# Patient Record
Sex: Female | Born: 1975 | Race: White | Hispanic: No | Smoking: Never smoker
Health system: Southern US, Community
[De-identification: ages and names within clinical notes are randomized; demographics above are authoritative.]

## PROBLEM LIST (undated history)

## (undated) HISTORY — PX: ABDOMINAL HYSTERECTOMY: SHX81

## (undated) HISTORY — PX: CHOLECYSTECTOMY: SHX55

---

## 2009-06-10 ENCOUNTER — Encounter: Admission: RE | Admit: 2009-06-10 | Discharge: 2009-06-10 | Payer: Self-pay | Admitting: Orthopedic Surgery

## 2009-11-23 ENCOUNTER — Ambulatory Visit: Payer: Self-pay | Admitting: Diagnostic Radiology

## 2009-11-23 ENCOUNTER — Emergency Department (HOSPITAL_BASED_OUTPATIENT_CLINIC_OR_DEPARTMENT_OTHER): Admission: EM | Admit: 2009-11-23 | Discharge: 2009-11-23 | Payer: Self-pay | Admitting: Emergency Medicine

## 2010-09-25 LAB — URINALYSIS, ROUTINE W REFLEX MICROSCOPIC
Bilirubin Urine: NEGATIVE
Hgb urine dipstick: NEGATIVE
Protein, ur: NEGATIVE mg/dL
Urobilinogen, UA: 1 mg/dL (ref 0.0–1.0)
pH: 6.5 (ref 5.0–8.0)

## 2010-09-25 LAB — DIFFERENTIAL
Basophils Absolute: 0.1 10*3/uL (ref 0.0–0.1)
Basophils Relative: 1 % (ref 0–1)
Lymphocytes Relative: 24 % (ref 12–46)
Lymphs Abs: 2.5 10*3/uL (ref 0.7–4.0)
Neutrophils Relative %: 65 % (ref 43–77)

## 2010-09-25 LAB — CBC
Hemoglobin: 14.9 g/dL (ref 12.0–15.0)
Platelets: 325 10*3/uL (ref 150–400)
RBC: 5.06 MIL/uL (ref 3.87–5.11)

## 2010-09-25 LAB — COMPREHENSIVE METABOLIC PANEL
AST: 38 U/L — ABNORMAL HIGH (ref 0–37)
Albumin: 4.2 g/dL (ref 3.5–5.2)
BUN: 5 mg/dL — ABNORMAL LOW (ref 6–23)
CO2: 30 mEq/L (ref 19–32)
Calcium: 9.5 mg/dL (ref 8.4–10.5)
GFR calc non Af Amer: >60 mL/min (ref >60)
Glucose, Bld: 85 mg/dL (ref 70–99)
Potassium: 2.7 mEq/L — CL (ref 3.5–5.1)

## 2013-08-29 ENCOUNTER — Emergency Department (HOSPITAL_BASED_OUTPATIENT_CLINIC_OR_DEPARTMENT_OTHER)
Admission: EM | Admit: 2013-08-29 | Discharge: 2013-08-29 | Disposition: A | Discharge status: Home / Self Care | Payer: Medicaid Other | Attending: Emergency Medicine | Admitting: Emergency Medicine

## 2013-08-29 ENCOUNTER — Encounter (HOSPITAL_BASED_OUTPATIENT_CLINIC_OR_DEPARTMENT_OTHER): Payer: Self-pay | Admitting: Emergency Medicine

## 2013-08-29 DIAGNOSIS — J069 Acute upper respiratory infection, unspecified: Secondary | ICD-10-CM | POA: Insufficient documentation

## 2013-08-29 DIAGNOSIS — M545 Low back pain, unspecified: Secondary | ICD-10-CM | POA: Insufficient documentation

## 2013-08-29 DIAGNOSIS — R630 Anorexia: Secondary | ICD-10-CM | POA: Insufficient documentation

## 2013-08-29 LAB — RAPID STREP SCREEN (MED CTR MEBANE ONLY): Streptococcus, Group A Screen (Direct): NEGATIVE

## 2013-08-29 MED ORDER — BENZONATATE 100 MG PO CAPS: 100.0000 mg | ORAL_CAPSULE | Freq: Three times a day (TID) | ORAL | Status: DC

## 2013-08-29 MED ORDER — GUAIFENESIN 100 MG/5ML PO LIQD: 100.0000 mg | ORAL | Status: DC | PRN

## 2013-08-29 NOTE — ED Provider Notes (Signed)
CSN: [id number]     Arrival date & time 08/29/13  [id number] History   First MD Initiated Contact with Patient 08/29/13 1158     Chief Complaint  Patient presents with  . Nasal Congestion  . Cough  . Sore Throat     (Consider location/radiation/quality/duration/timing/severity/associated sxs/prior Treatment) HPI  38 year old female here with viral syndrome. Patient states since yesterday she developed subjective fever, chills, headache, sore throat, nonproductive cough, occasional sneeze and, and general body aches including pain to her low back and to her arms and legs. She has been taking Mucinex and Tylenol with some improvement. She has decreased appetite. She denies neck stiffness, hemoptysis, shortness of breath, abdominal pain, nausea vomiting diarrhea, dysuria, or rash. States she was diagnosed with a sinus infection and infection in January and was treated with amoxicillin and Keflex with resolutions of symptoms. State she felt normal for about 2 weeks until this symptom started yesterday.  History reviewed. No pertinent past medical history. History reviewed. No pertinent past surgical history. No family history on file. History  Substance Use Topics  . Smoking status: Never Smoker   . Smokeless tobacco: Not on file  . Alcohol Use: Not on file   OB History   Grav Para Term Preterm Abortions TAB SAB Ect Mult Living                 Review of Systems  All other systems reviewed and are negative.      Allergies  Morphine and related and Sulfa antibiotics  Home Medications  No current outpatient prescriptions on file. BP 123/84  Pulse 73  Temp(Src) 98.3 F (36.8 C) (Oral)  Resp 18  Wt 134 lb (60.782 kg)  SpO2 99% Physical Exam  Nursing note and vitals reviewed. Constitutional: She is oriented to person, place, and time. She appears well-developed and well-nourished. No distress.  HENT:  Head: Atraumatic.  Ears: Normal TM bilateral  Nose: normal nares  Throat:  Uvula is midline, no tonsillar enlargement or exudates, no trismus  Eyes: Conjunctivae are normal.  Neck: Neck supple.  No nuchal rigidity  Cardiovascular: Normal rate and regular rhythm.   Pulmonary/Chest: Effort normal and breath sounds normal. She has no wheezes. She has no rales.  Abdominal: Soft. There is no tenderness.  Musculoskeletal: She exhibits tenderness (Mild paralumbar tenderness without CVA tenderness).  Neurological: She is alert and oriented to person, place, and time.  Skin: No rash noted.  Psychiatric: She has a normal mood and affect.    ED Course  Procedures (including critical care time)  Patient with URI symptoms. She is afebrile with stable normal vital sign, no hypoxia to suggest pneumonia. Her lungs sound clear. No obvious evidence suggestive of strep however a rapid strep test was obtained  Labs Review Labs Reviewed  RAPID STREP SCREEN  CULTURE, GROUP A STREP   Imaging Review No results found.  EKG Interpretation   None       MDM   Final diagnoses:  URI (upper respiratory infection)    BP 123/84  Pulse 73  Temp(Src) 98.3 F (36.8 C) (Oral)  Resp 18  Wt 134 lb (60.782 kg)  SpO2 99%     Fayrene HelperBowie Xenia Nile, PA-C 08/29/13 1333

## 2013-08-29 NOTE — ED Notes (Signed)
Patient here with 3rd round of illness since early January. Complains of 2 days of sore throat, headache, cough and general body aches. Has taken amoxicillin and cephalexin without relief

## 2013-08-29 NOTE — ED Provider Notes (Signed)
Medical screening examination/treatment/procedure(s) were performed by non-physician practitioner and as supervising physician I was immediately available for consultation/collaboration.  EKG Interpretation   None         Reyaan Thoma S Esmerelda Finnigan, MD 08/29/13 2018 

## 2013-08-29 NOTE — Discharge Instructions (Signed)

## 2013-08-31 LAB — CULTURE, GROUP A STREP

## 2014-06-03 ENCOUNTER — Encounter (HOSPITAL_BASED_OUTPATIENT_CLINIC_OR_DEPARTMENT_OTHER): Payer: Self-pay

## 2014-06-03 ENCOUNTER — Emergency Department (HOSPITAL_BASED_OUTPATIENT_CLINIC_OR_DEPARTMENT_OTHER): Payer: 59

## 2014-06-03 ENCOUNTER — Emergency Department (HOSPITAL_BASED_OUTPATIENT_CLINIC_OR_DEPARTMENT_OTHER)
Admission: EM | Admit: 2014-06-03 | Discharge: 2014-06-04 | Disposition: A | Discharge status: Home / Self Care | Payer: 59 | Attending: Emergency Medicine | Admitting: Emergency Medicine

## 2014-06-03 DIAGNOSIS — M940 Chondrocostal junction syndrome [Tietze]: Secondary | ICD-10-CM | POA: Diagnosis not present

## 2014-06-03 DIAGNOSIS — R079 Chest pain, unspecified: Secondary | ICD-10-CM | POA: Diagnosis present

## 2014-06-03 DIAGNOSIS — R52 Pain, unspecified: Secondary | ICD-10-CM

## 2014-06-03 NOTE — ED Notes (Signed)
C/p CP x 2 days

## 2014-06-03 NOTE — ED Notes (Signed)
EDP reviewed EKG-no orders

## 2014-06-04 ENCOUNTER — Encounter (HOSPITAL_BASED_OUTPATIENT_CLINIC_OR_DEPARTMENT_OTHER): Payer: Self-pay | Admitting: Emergency Medicine

## 2014-06-04 DIAGNOSIS — M940 Chondrocostal junction syndrome [Tietze]: Secondary | ICD-10-CM | POA: Diagnosis not present

## 2014-06-04 LAB — CBC WITH DIFFERENTIAL/PLATELET
BASOS ABS: 0.1 10*3/uL (ref 0.0–0.1)
Basophils Relative: 0 % (ref 0–1)
EOS ABS: 0.2 10*3/uL (ref 0.0–0.7)
Eosinophils Relative: 1 % (ref 0–5)
HCT: 40.2 % (ref 36.0–46.0)
Hemoglobin: 13.4 g/dL (ref 12.0–15.0)
LYMPHS ABS: 4.2 10*3/uL — AB (ref 0.7–4.0)
Lymphocytes Relative: 31 % (ref 12–46)
MCH: 28.8 pg (ref 26.0–34.0)
MCHC: 33.3 g/dL (ref 30.0–36.0)
MCV: 86.3 fL (ref 78.0–100.0)
MONOS PCT: 8 % (ref 3–12)
Monocytes Absolute: 1.1 10*3/uL — ABNORMAL HIGH (ref 0.1–1.0)
Neutro Abs: 8 10*3/uL — ABNORMAL HIGH (ref 1.7–7.7)
Neutrophils Relative %: 60 % (ref 43–77)
PLATELETS: 373 10*3/uL (ref 150–400)
RBC: 4.66 MIL/uL (ref 3.87–5.11)
RDW: 12.8 % (ref 11.5–15.5)
WBC: 13.5 10*3/uL — ABNORMAL HIGH (ref 4.0–10.5)

## 2014-06-04 LAB — BASIC METABOLIC PANEL
ANION GAP: 13 (ref 5–15)
BUN: 9 mg/dL (ref 6–23)
CHLORIDE: 102 meq/L (ref 96–112)
CO2: 26 mEq/L (ref 19–32)
Calcium: 9.4 mg/dL (ref 8.4–10.5)
Creatinine, Ser: 0.7 mg/dL (ref 0.50–1.10)
GLUCOSE: 95 mg/dL (ref 70–99)
POTASSIUM: 3.9 meq/L (ref 3.7–5.3)
SODIUM: 141 meq/L (ref 137–147)

## 2014-06-04 LAB — TROPONIN I: Troponin I: <0.3 ng/mL (ref <0.30)

## 2014-06-04 LAB — D-DIMER, QUANTITATIVE: D-Dimer, Quant: 0.37 ug/mL-FEU (ref 0.00–0.48)

## 2014-06-04 MED ORDER — MELOXICAM 7.5 MG PO TABS: 7.5000 mg | ORAL_TABLET | Freq: Every day | ORAL | Status: DC

## 2014-06-04 MED ORDER — METHOCARBAMOL 500 MG PO TABS
1000.0000 mg | ORAL_TABLET | Freq: Once | ORAL | Status: AC
  Administered 2014-06-04: 1000 mg via ORAL

## 2014-06-04 MED ORDER — METHOCARBAMOL 500 MG PO TABS: 500.0000 mg | ORAL_TABLET | Freq: Two times a day (BID) | ORAL | Status: DC

## 2014-06-04 MED ORDER — KETOROLAC TROMETHAMINE 30 MG/ML IJ SOLN
30.0000 mg | Freq: Once | INTRAMUSCULAR | Status: AC
  Administered 2014-06-04: 30 mg via INTRAVENOUS
  Filled 2014-06-04: qty 1

## 2014-06-04 MED ORDER — METHOCARBAMOL 500 MG PO TABS
ORAL_TABLET | ORAL | Status: AC
  Filled 2014-06-04: qty 2

## 2014-06-04 MED ORDER — GI COCKTAIL ~~LOC~~
30.0000 mL | Freq: Once | ORAL | Status: AC
  Administered 2014-06-04: 30 mL via ORAL
  Filled 2014-06-04: qty 30

## 2014-06-04 MED ORDER — LANSOPRAZOLE 30 MG PO TBDP: 30.0000 mg | ORAL_TABLET | Freq: Every day | ORAL | Status: DC

## 2014-06-04 NOTE — ED Provider Notes (Signed)
CSN: [id number]     Arrival date & time 06/03/14  2132 History   First MD Initiated Contact with Patient 06/04/14 0029     Chief Complaint  Patient presents with  . Chest Pain     (Consider location/radiation/quality/duration/timing/severity/associated sxs/prior Treatment) Patient is a 38 y.o. female presenting with chest pain. The history is provided by the patient.  Chest Pain Pain location:  Substernal area Pain quality: aching and radiating   Pain radiates to:  Neck Pain severity:  Severe Onset quality:  Gradual Duration:  5 days Timing:  Constant Progression:  Worsening Chronicity:  New Context: movement   Context: not eating   Relieved by:  Nothing Worsened by:  Nothing tried Ineffective treatments:  Antacids Associated symptoms: no cough, no fever, no lower extremity edema, no nausea, no shortness of breath, not vomiting and no weakness   Risk factors: no aortic disease     History reviewed. No pertinent past medical history. Past Surgical History  Procedure Laterality Date  . Abdominal hysterectomy    . Cesarean section    . Cholecystectomy     History reviewed. No pertinent family history. History  Substance Use Topics  . Smoking status: Never Smoker   . Smokeless tobacco: Not on file  . Alcohol Use: Yes   OB History    No data available     Review of Systems  Constitutional: Negative for fever.  Respiratory: Negative for cough, shortness of breath and wheezing.   Cardiovascular: Positive for chest pain. Negative for leg swelling.  Gastrointestinal: Negative for nausea and vomiting.  Neurological: Negative for weakness.  All other systems reviewed and are negative.     Allergies  Morphine and related and Sulfa antibiotics  Home Medications   Prior to Admission medications   Not on File   BP 109/63 mmHg  Pulse 72  Temp(Src) 98.3 F (36.8 C) (Oral)  Resp 16  Ht 5\' 2"  (1.575 m)  Wt 130 lb (58.968 kg)  BMI 23.77 kg/m2  SpO2  99% Physical Exam  Constitutional: She is oriented to person, place, and time. She appears well-developed and well-nourished. No distress.  HENT:  Head: Normocephalic and atraumatic.  Mouth/Throat: Oropharynx is clear and moist.  Eyes: Conjunctivae are normal. Pupils are equal, round, and reactive to light.  Neck: Normal range of motion. Neck supple.  Cardiovascular: Normal rate, regular rhythm and intact distal pulses.   Pulmonary/Chest: Effort normal and breath sounds normal. No respiratory distress. She has no wheezes. She has no rales. She exhibits tenderness.  Abdominal: Soft. Bowel sounds are normal. There is no tenderness. There is no rebound and no guarding.  Musculoskeletal: Normal range of motion. She exhibits no edema or tenderness.  Neurological: She is alert and oriented to person, place, and time.  Skin: Skin is warm and dry.    ED Course  Procedures (including critical care time) Labs Review Labs Reviewed  CBC WITH DIFFERENTIAL - Abnormal; Notable for the following:    WBC 13.5 (*)    Neutro Abs 8.0 (*)    Lymphs Abs 4.2 (*)    Monocytes Absolute 1.1 (*)    All other components within normal limits  BASIC METABOLIC PANEL  TROPONIN I  D-DIMER, QUANTITATIVE    Imaging Review Dg Chest 2 View  06/04/2014   CLINICAL DATA:  Mid to LEFT chest pain and shortness of breath for 2 days radiating to RIGHT upper extremity.  EXAM: CHEST  2 VIEW  COMPARISON:  None.  FINDINGS:  Cardiomediastinal silhouette is unremarkable. The lungs are clear without pleural effusions or focal consolidations. Trachea projects midline and there is no pneumothorax. Soft tissue planes and included osseous structures are non-suspicious. Surgical clips in the included right abdomen likely reflect cholecystectomy.  IMPRESSION: Normal chest radiograph.   Electronically Signed   By: Awilda Metroourtnay  Bloomer   On: 06/04/2014 00:04     EKG Interpretation   Date/Time:  Thursday June 03 2014 21:38:37  EST Ventricular Rate:  104 PR Interval:  134 QRS Duration: 78 QT Interval:  324 QTC Calculation: 426 R Axis:   39 Text Interpretation:  Sinus tachycardia Cannot rule out Anterior infarct ,  age undetermined Abnormal ECG No previous ECGs available Confirmed by  Manus GunningANCOUR  MD, STEPHEN [phone]([id number]) on 06/03/2014 9:40:33 PM      MDM   Final diagnoses:  Pain    Costochondritis.  Will treat with pain medication and muscle relaxants.  Have added prevacid to prevent stomach upset.  Follow up with your PMD for ongoing care    Dearia Wilmouth K Atiana Levier-Rasch, MD 06/04/14 [id number]

## 2014-06-11 ENCOUNTER — Ambulatory Visit (INDEPENDENT_AMBULATORY_CARE_PROVIDER_SITE_OTHER): Payer: 59 | Admitting: Physician Assistant

## 2014-06-11 ENCOUNTER — Encounter: Payer: Self-pay | Admitting: Physician Assistant

## 2014-06-11 VITALS — BP 115/48 | HR 71 | Ht 62.0 in | Wt 125.0 lb

## 2014-06-11 DIAGNOSIS — R079 Chest pain, unspecified: Secondary | ICD-10-CM | POA: Diagnosis not present

## 2014-06-11 DIAGNOSIS — F411 Generalized anxiety disorder: Secondary | ICD-10-CM

## 2014-06-11 DIAGNOSIS — G4700 Insomnia, unspecified: Secondary | ICD-10-CM

## 2014-06-11 DIAGNOSIS — F329 Major depressive disorder, single episode, unspecified: Secondary | ICD-10-CM

## 2014-06-11 DIAGNOSIS — R52 Pain, unspecified: Secondary | ICD-10-CM

## 2014-06-11 DIAGNOSIS — R1013 Epigastric pain: Secondary | ICD-10-CM | POA: Diagnosis not present

## 2014-06-11 DIAGNOSIS — F32A Depression, unspecified: Secondary | ICD-10-CM

## 2014-06-11 MED ORDER — ALPRAZOLAM 0.5 MG PO TABS: 0.5000 mg | ORAL_TABLET | Freq: Two times a day (BID) | ORAL | Status: DC | PRN

## 2014-06-11 MED ORDER — PANTOPRAZOLE SODIUM 40 MG PO TBEC: 40.0000 mg | DELAYED_RELEASE_TABLET | Freq: Two times a day (BID) | ORAL | Status: DC

## 2014-06-11 MED ORDER — BUPROPION HCL ER (XL) 150 MG PO TB24: 150.0000 mg | ORAL_TABLET | Freq: Every day | ORAL | Status: DC

## 2014-06-11 MED ORDER — SUCRALFATE 1 G PO TABS: 1.0000 g | ORAL_TABLET | Freq: Four times a day (QID) | ORAL | Status: DC

## 2014-06-11 NOTE — Patient Instructions (Addendum)
Stop Mobic. Stop prevacid. STart protonix. carafate as needed.   Start wellbutrin.

## 2014-06-12 LAB — CBC WITH DIFFERENTIAL/PLATELET
BASOS PCT: 1 % (ref 0–1)
Basophils Absolute: 0.1 10*3/uL (ref 0.0–0.1)
EOS ABS: 0.2 10*3/uL (ref 0.0–0.7)
EOS PCT: 2 % (ref 0–5)
HCT: 40.6 % (ref 36.0–46.0)
HEMOGLOBIN: 14.1 g/dL (ref 12.0–15.0)
Lymphocytes Relative: 42 % (ref 12–46)
Lymphs Abs: 3.3 10*3/uL (ref 0.7–4.0)
MCH: 28.7 pg (ref 26.0–34.0)
MCHC: 34.7 g/dL (ref 30.0–36.0)
MCV: 82.5 fL (ref 78.0–100.0)
MONO ABS: 0.7 10*3/uL (ref 0.1–1.0)
MONOS PCT: 9 % (ref 3–12)
MPV: 9.9 fL (ref 9.4–12.4)
NEUTROS PCT: 46 % (ref 43–77)
Neutro Abs: 3.6 10*3/uL (ref 1.7–7.7)
Platelets: 429 10*3/uL — ABNORMAL HIGH (ref 150–400)
RBC: 4.92 MIL/uL (ref 3.87–5.11)
RDW: 13.2 % (ref 11.5–15.5)
WBC: 7.9 10*3/uL (ref 4.0–10.5)

## 2014-06-12 LAB — FERRITIN: Ferritin: 29 ng/mL (ref 10–291)

## 2014-06-14 DIAGNOSIS — F32A Depression, unspecified: Secondary | ICD-10-CM | POA: Insufficient documentation

## 2014-06-14 DIAGNOSIS — R079 Chest pain, unspecified: Secondary | ICD-10-CM | POA: Insufficient documentation

## 2014-06-14 DIAGNOSIS — F411 Generalized anxiety disorder: Secondary | ICD-10-CM | POA: Insufficient documentation

## 2014-06-14 DIAGNOSIS — G4700 Insomnia, unspecified: Secondary | ICD-10-CM | POA: Insufficient documentation

## 2014-06-14 DIAGNOSIS — F329 Major depressive disorder, single episode, unspecified: Secondary | ICD-10-CM | POA: Insufficient documentation

## 2014-06-14 LAB — H. PYLORI ANTIBODY, IGG: H Pylori IgG: <0.4 {ISR}

## 2014-06-14 NOTE — Progress Notes (Signed)
   Subjective:    Patient ID: Mary Faulkner, female    DOB: 09/21/1975, 38 y.o.   MRN: [id number]  HPI Pt is a 38 yo female who presents to the clinic to establish care.   .. Active Ambulatory Problems    Diagnosis Date Noted  . Depression 06/14/2014  . Generalized anxiety disorder 06/14/2014  . Insomnia 06/14/2014  . Pain in the chest 06/14/2014   Resolved Ambulatory Problems    Diagnosis Date Noted  . No Resolved Ambulatory Problems   No Additional Past Medical History   .Marland Kitchen. Family History  Problem Relation Age of Onset  . Hyperlipidemia Father   . Hypertension Father   . Hyperlipidemia Brother   . Cancer Paternal Grandmother   . Alcohol abuse Paternal Grandfather   . Cancer Paternal Grandfather    .Marland Kitchen. History   Social History  . Marital Status: Divorced    Spouse Name: N/A    Number of Children: N/A  . Years of Education: N/A   Occupational History  . Not on file.   Social History Main Topics  . Smoking status: Never Smoker   . Smokeless tobacco: Not on file  . Alcohol Use: Yes  . Drug Use: No  . Sexual Activity: Yes    Birth Control/ Protection: Surgical   Other Topics Concern  . Not on file   Social History Narrative   Pt was seen in ED on 06/03/14 for chest pain. She had full cardiac work up that was negative and dx was costrochondritis. CP is still present but not quite as bad today. Having a lot of pain after eating and drinking. She denies any black tarry stools but does have occasional bright red blood when she gets constipated. She takes prevacid but does not seem to be helping. She admits to ongoing anxiety but tried celexa and paxil and just make her feel like she is in a fog.    Review of Systems  All other systems reviewed and are negative.      Objective:   Physical Exam  Constitutional: She is oriented to person, place, and time. She appears well-developed and well-nourished.  HENT:  Head: Normocephalic and atraumatic.  Right Ear:  External ear normal.  Left Ear: External ear normal.  Eyes: Conjunctivae are normal. Right eye exhibits no discharge. Left eye exhibits no discharge.  Cardiovascular: Normal rate, regular rhythm and normal heart sounds.   Pulmonary/Chest: Effort normal and breath sounds normal.  Abdominal: There is no rebound and no guarding.  Diffusely tender over epigastric area.   Neurological: She is alert and oriented to person, place, and time.  Skin: Skin is dry.  Psychiatric: She has a normal mood and affect. Her behavior is normal.          Assessment & Plan:  Pain after eating/CP/abdominal pain- will get some labs and check for h.pylori. Start protonix and carafate. Stop prevacid. gerd diet given. Follow up in 4 weeks. Sooner if no improvement.   Anxiety/Depression- PHQ-9 was 7 and GAD-7 was 13. Will try wellbutrin 150mg . Discussed side effects and timeline of improvement. Xanax only as needed. Discussed abuse potential.  Follow up in 4-6 weeks.   Insomnia- discussed melatonin. Good bedtime routine. Follow up if no improvement.

## 2014-07-14 ENCOUNTER — Telehealth (HOSPITAL_COMMUNITY): Payer: Self-pay | Admitting: *Deleted

## 2014-07-14 NOTE — Telephone Encounter (Signed)
Pt's mother would like to speak Lloyd Hugereil about pt's behavior prior to appt on 1/13. Please call pt's mother at [phone].

## 2014-07-26 ENCOUNTER — Ambulatory Visit: Payer: 59 | Admitting: Physician Assistant

## 2014-07-28 ENCOUNTER — Ambulatory Visit (INDEPENDENT_AMBULATORY_CARE_PROVIDER_SITE_OTHER): Payer: 59 | Admitting: Physician Assistant

## 2014-07-28 ENCOUNTER — Encounter: Payer: Self-pay | Admitting: Physician Assistant

## 2014-07-28 VITALS — BP 104/61 | HR 71 | Ht 62.0 in | Wt 123.0 lb

## 2014-07-28 DIAGNOSIS — K5900 Constipation, unspecified: Secondary | ICD-10-CM

## 2014-07-28 DIAGNOSIS — G4700 Insomnia, unspecified: Secondary | ICD-10-CM

## 2014-07-28 DIAGNOSIS — F411 Generalized anxiety disorder: Secondary | ICD-10-CM

## 2014-07-28 DIAGNOSIS — F32A Depression, unspecified: Secondary | ICD-10-CM

## 2014-07-28 DIAGNOSIS — F329 Major depressive disorder, single episode, unspecified: Secondary | ICD-10-CM

## 2014-07-28 MED ORDER — LINACLOTIDE 290 MCG PO CAPS: 290.0000 ug | ORAL_CAPSULE | Freq: Every day | ORAL | Status: DC

## 2014-07-28 MED ORDER — BUPROPION HCL ER (XL) 300 MG PO TB24: 300.0000 mg | ORAL_TABLET | Freq: Every day | ORAL | Status: DC

## 2014-07-28 NOTE — Patient Instructions (Signed)
unisom and melatonin together for sleep first. Up to 10 mg of melatonin.    Consider Belsomnra for sleep.   Increase wellbutrin to 300mg  daily.

## 2014-07-29 ENCOUNTER — Encounter: Payer: Self-pay | Admitting: Physician Assistant

## 2014-07-29 NOTE — Progress Notes (Signed)
   Subjective:    Patient ID: Mary Faulkner, female    DOB: 12/19/1975, 39 y.o.   MRN: [id number]  HPI Pt presents to the clinic to follow up.   Anxiety/depression- she does feel some better but not where she would like to be. She feels like anxiety is main complaint. She just feels uneasy and unable to relax most of the time. She admits has had a hard couple of weeks with family issues. No suicidal or homicidal thoughts.   Insomnia-continues to be difficult to sleep. Did not start melatonin. Hx of intolerance to other sleep aids. Trouble relaxing encough to go to sleep. Occasional will use a xanax but does not like too.   She has also had problems having bowel movements. This has been a problem for years since she was a teenager. Using mucinex fairly regularly but still can go days with no BM. When she gets backed up has some abdominal pain.    Review of Systems  All other systems reviewed and are negative.      Objective:   Physical Exam  Constitutional: She appears well-developed and well-nourished.  HENT:  Head: Normocephalic and atraumatic.  Cardiovascular: Normal rate, regular rhythm and normal heart sounds.   Pulmonary/Chest: Effort normal and breath sounds normal.  Abdominal: She exhibits distension. There is no tenderness. There is no rebound and no guarding.  Slight distention of abdomen.  Hypoactive bowel sounds.    Psychiatric: She has a normal mood and affect. Her behavior is normal.          Assessment & Plan:  Anxiety/depression- GAD-7 was 12 and PHQ-9 was 7. numbners not down that much but patient reports better response than this. Will increase wellbutrin to 300mg  daily. Certainly could consider switching to Viibrd. Pt will try wellbutrin increase first. Follow up in 3 months.   Insomnia- discussed unisom and melatonin 10mg . She has to try to know will work. Only use xanax as needed.   Chronic constipation- try linzess 290mcg daily. Discussed side effects.  Free trial given. Follow up as needed.

## 2014-09-20 ENCOUNTER — Ambulatory Visit (INDEPENDENT_AMBULATORY_CARE_PROVIDER_SITE_OTHER): Payer: 59 | Admitting: Physician Assistant

## 2014-09-20 ENCOUNTER — Encounter: Payer: Self-pay | Admitting: Physician Assistant

## 2014-09-20 VITALS — BP 116/74 | HR 69 | Wt 125.0 lb

## 2014-09-20 DIAGNOSIS — M6248 Contracture of muscle, other site: Secondary | ICD-10-CM

## 2014-09-20 DIAGNOSIS — G5601 Carpal tunnel syndrome, right upper limb: Secondary | ICD-10-CM

## 2014-09-20 DIAGNOSIS — M62838 Other muscle spasm: Secondary | ICD-10-CM

## 2014-09-20 MED ORDER — CYCLOBENZAPRINE HCL 10 MG PO TABS
10.0000 mg | ORAL_TABLET | Freq: Every day | ORAL | Status: DC
Start: 2014-09-20 — End: 2014-11-02

## 2014-09-20 MED ORDER — TRAMADOL HCL 50 MG PO TABS: 50.0000 mg | ORAL_TABLET | Freq: Four times a day (QID) | ORAL | Status: DC | PRN

## 2014-09-20 NOTE — Progress Notes (Signed)
   Subjective:    Patient ID: Mary PattenMelissa M Bleich, female    DOB: 04/08/1976, 39 y.o.   MRN: [id number]  HPI  Pt is a 39 yo female who presents to the clinic with 3 weeks of right hand to elbow pain, numbness, sharp pain and burns but no tingling. Symptoms going mostly into middle finger. Symptoms have started to effect her hand grip. She can't open drink bottle or grip pencil.  No trauma or injury. She does constant data entry with right hand. Aleve helps very minimally. Her neck feels stiff. No trauma there either.   Review of Systems  All other systems reviewed and are negative.      Objective:   Physical Exam  Constitutional: She is oriented to person, place, and time. She appears well-developed and well-nourished.  HENT:  Head: Normocephalic and atraumatic.  Cardiovascular: Normal rate, regular rhythm and normal heart sounds.   Pulmonary/Chest: Effort normal and breath sounds normal.  Musculoskeletal:  ROM of neck normal.  No pain with palpation over c-spine.  Normal ROM of shoulder and elbow.  Hand grip 1/5, very decreased on right.  positive numbness with phalens sign on right.  Negative finklestein on right.   Neurological: She is alert and oriented to person, place, and time.  Skin: Skin is dry.  Psychiatric: She has a normal mood and affect. Her behavior is normal.          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 Distrubution seems like median nerve. Will order EMG. Went ahead and fitted patient for brace to wear as often as possible. Prednisone taper given. mobic to start after finishing prednisone. HO given with exercises and ice regularly. Suggested a few days rest from data entry but pt declined.    Neck pain/spasm- flexeril had bedtime to try. mobic should also help. Warm compresses.

## 2014-09-20 NOTE — Patient Instructions (Addendum)
EMG to scheduled.  mobic start after prednisone take once daily with meals. No NSAID.  Prednisone as directed.  Flexeril at bedtime for muscle spasm of neck.  Wear wrist brace 24/7.   Carpal Tunnel Syndrome The carpal tunnel is a narrow area located on the palm side of your wrist. The tunnel is formed by the wrist bones and ligaments. Nerves, blood vessels, and tendons pass through the carpal tunnel. Repeated wrist motion or certain diseases may cause swelling within the tunnel. This swelling pinches the main nerve in the wrist (median nerve) and causes the painful hand and arm condition called carpal tunnel syndrome. CAUSES   Repeated wrist motions.  Wrist injuries.  Certain diseases like arthritis, diabetes, alcoholism, hyperthyroidism, and kidney failure.  Obesity.  Pregnancy. SYMPTOMS   A "pins and needles" feeling in your fingers or hand, especially in your thumb, index and middle fingers.  Tingling or numbness in your fingers or hand.  An aching feeling in your entire arm, especially when your wrist and elbow are bent for long periods of time.  Wrist pain that goes up your arm to your shoulder.  Pain that goes down into your palm or fingers.  A weak feeling in your hands. DIAGNOSIS  Your health care provider will take your history and perform a physical exam. An electromyography test may be needed. This test measures electrical signals sent out by your nerves into the muscles. The electrical signals are usually slowed by carpal tunnel syndrome. You may also need X-rays. TREATMENT  Carpal tunnel syndrome may clear up by itself. Your health care provider may recommend a wrist splint or medicine such as a nonsteroidal anti-inflammatory medicine. Cortisone injections may help. Sometimes, surgery may be needed to free the pinched nerve.  HOME CARE INSTRUCTIONS   Take all medicine as directed by your health care provider. Only take over-the-counter or prescription medicines for  pain, discomfort, or fever as directed by your health care provider.  If you were given a splint to keep your wrist from bending, wear it as directed. It is important to wear the splint at night. Wear the splint for as long as you have pain or numbness in your hand, arm, or wrist. This may take 1 to 2 months.  Rest your wrist from any activity that may be causing your pain. If your symptoms are work-related, you may need to talk to your employer about changing to a job that does not require using your wrist.  Put ice on your wrist after long periods of wrist activity.  Put ice in a plastic bag.  Place a towel between your skin and the bag.  Leave the ice on for 15-20 minutes, 03-04 times a day.  Keep all follow-up visits as directed by your health care provider. This includes any orthopedic referrals, physical therapy, and rehabilitation. Any delay in getting necessary care could result in a delay or failure of your condition to heal. SEEK IMMEDIATE MEDICAL CARE IF:   You have new, unexplained symptoms.  Your symptoms get worse and are not helped or controlled with medicines. MAKE SURE YOU:   Understand these instructions.  Will watch your condition.  Will get help right away if you are not doing well or get worse. Document Released: 06/22/2000 Document Revised: 11/09/2013 Document Reviewed: 05/11/2011 [facility] Patient Information 2015 [employer]. This information is not intended to replace advice given to you by your health care provider. Make sure you discuss any questions you have with your health  care provider.   

## 2014-09-21 ENCOUNTER — Ambulatory Visit: Payer: 59 | Admitting: Physician Assistant

## 2014-09-21 ENCOUNTER — Telehealth: Payer: Self-pay | Admitting: *Deleted

## 2014-09-21 NOTE — Telephone Encounter (Signed)
Pt left vm wanting you [place]

## 2014-09-22 DIAGNOSIS — M62838 Other muscle spasm: Secondary | ICD-10-CM | POA: Insufficient documentation

## 2014-09-22 NOTE — Telephone Encounter (Signed)
Referral was released.

## 2014-09-23 ENCOUNTER — Other Ambulatory Visit: Payer: Self-pay | Admitting: *Deleted

## 2014-09-23 MED ORDER — MELOXICAM 15 MG PO TABS: 15.0000 mg | ORAL_TABLET | Freq: Every day | ORAL | Status: DC

## 2014-09-23 MED ORDER — PREDNISONE 20 MG PO TABS: ORAL_TABLET | ORAL | Status: DC

## 2014-10-04 ENCOUNTER — Telehealth: Payer: Self-pay | Admitting: *Deleted

## 2014-10-04 NOTE — Telephone Encounter (Signed)
Pt left vm stating that she can't get in to see the neuro until Fri.  She stated that her pain is worse now and with some burning sensation and it's keeping her awake at night.  She wanted to know if you want her to come in before her neuro appt or to wait until after.  Please advise.

## 2014-10-05 ENCOUNTER — Other Ambulatory Visit: Payer: Self-pay | Admitting: *Deleted

## 2014-10-05 MED ORDER — GABAPENTIN 100 MG PO CAPS: ORAL_CAPSULE | ORAL | Status: DC

## 2014-10-05 NOTE — Telephone Encounter (Signed)
i will send over gabapentin to use at bedtime and if tolerated well up to three times a day to see if helps with symptoms. Keep neuro appt. We can follow after as needed.   Please send gabapentin 100mg  qhs for 3 days increase to 100mg  am and pm for 3 days as tolerated and up to 100mg  TID #90 NRF can increase to higher dosage if improving.

## 2014-10-05 NOTE — Telephone Encounter (Signed)
Rx sent.  LMOM notifying pt. 

## 2014-10-08 ENCOUNTER — Ambulatory Visit: Payer: 59 | Admitting: Physician Assistant

## 2014-10-08 ENCOUNTER — Ambulatory Visit (INDEPENDENT_AMBULATORY_CARE_PROVIDER_SITE_OTHER): Payer: 59 | Admitting: Family Medicine

## 2014-10-08 ENCOUNTER — Ambulatory Visit (INDEPENDENT_AMBULATORY_CARE_PROVIDER_SITE_OTHER): Payer: 59

## 2014-10-08 ENCOUNTER — Other Ambulatory Visit: Payer: Self-pay | Admitting: Family Medicine

## 2014-10-08 ENCOUNTER — Encounter: Payer: Self-pay | Admitting: Family Medicine

## 2014-10-08 VITALS — BP 105/54 | HR 73 | Ht 62.0 in | Wt 127.0 lb

## 2014-10-08 DIAGNOSIS — M79641 Pain in right hand: Secondary | ICD-10-CM | POA: Diagnosis not present

## 2014-10-08 NOTE — Progress Notes (Signed)
   Subjective:    Patient ID: Mary PattenMelissa M Nielson, female    DOB: 11/09/1975, 39 y.o.   MRN: [id number]  HPI  Hand pain - R hand [facility] emg studies she actually had an appt this AM but it got cancelled until next friday she would like to have xrays done today of her hand.  Has been wearing a cock-up splint. Helps some and has been using it for 4 weeks. mostly just reminds her not to use that part of her hand.  Also tried prednisone. Last few days has been getting a burning in her palm.  Has been using Aleve while at work.  No trauma or injury. Occ when tries to  pick something up will feel like a tight rubber band in the middle of her hand. Hand radiating into her arm but that has started since wearing the wrist brace.   She does data entry for a living.   Review of Systems     Objective:   Physical Exam  Constitutional: She is oriented to person, place, and time. She appears well-developed and well-nourished.  Musculoskeletal:  Right hand - pain over the distal MC of the middle finger, mostly on the palmar side.  Neg Finkelsteins test. Wrist is nonotender with NROM. Finger strength is 5/5 bilaterally. No swelling or palpable nodules over the palm.   Neurological: She is alert and oriented to person, place, and time.  Skin: Skin is warm and dry.  Psychiatric: She has a normal mood and affect. Her behavior is normal.        Assessment & Plan:  Right hand pain mostly located at the base of the middle finger and radiates up into the middle finger and down to her elbow at times.  Will get xray today they likely will be normal session with no history of trauma or injury. Consider it could be a tendinitis. She does have an EMGs that he scheduled for next Friday. It was originally supposed to be this morning but she got call last night saying it had been moved to next week. I encouraged her to quit wearing the splint since it's not really been helpful and  after 4 weeks it can start to cause atrophy and problem's with the other muscles in her hand. If she's going to do a lot with her hand and she wants to slip it back on for a short period time I think that that's fine. Continue anti-inflammatory as needed..Marland Kitchen

## 2014-10-18 ENCOUNTER — Telehealth: Payer: Self-pay | Admitting: *Deleted

## 2014-10-18 NOTE — Telephone Encounter (Signed)
Patient called stating that she received a [facility] about [facility]  She wanted to know if she should keep appt considering results from appt on 4/08. Encouraged patient to keep her up coming app[facility].

## 2014-10-19 ENCOUNTER — Telehealth: Payer: Self-pay | Admitting: Physician Assistant

## 2014-10-19 DIAGNOSIS — M5412 Radiculopathy, cervical region: Secondary | ICD-10-CM

## 2014-10-19 DIAGNOSIS — G5601 Carpal tunnel syndrome, right upper limb: Secondary | ICD-10-CM

## 2014-10-19 NOTE — Telephone Encounter (Signed)
i just got EMG. Results. Borderline right carpal tunnel and mild right C7 degenerative changes. I do think this warrants xray of cspine. Would you like to come in? Are you wanting to be sent to surgeon for surgery consult?

## 2014-10-20 ENCOUNTER — Ambulatory Visit (INDEPENDENT_AMBULATORY_CARE_PROVIDER_SITE_OTHER): Payer: 59

## 2014-10-20 DIAGNOSIS — M5412 Radiculopathy, cervical region: Secondary | ICD-10-CM | POA: Diagnosis not present

## 2014-10-20 DIAGNOSIS — M2578 Osteophyte, vertebrae: Secondary | ICD-10-CM | POA: Diagnosis not present

## 2014-10-20 NOTE — Telephone Encounter (Signed)
LMOM for pt to return call. 

## 2014-10-20 NOTE — Telephone Encounter (Signed)
Pt called back & stated that [facility] & PT for her hand.  I placed order for cspine xr & will call pt with results.

## 2014-10-27 ENCOUNTER — Ambulatory Visit: Payer: 59 | Admitting: Physician Assistant

## 2014-11-02 ENCOUNTER — Encounter: Payer: Self-pay | Admitting: Sports Medicine

## 2014-11-02 ENCOUNTER — Ambulatory Visit: Payer: 59 | Admitting: Physician Assistant

## 2014-11-02 ENCOUNTER — Ambulatory Visit (INDEPENDENT_AMBULATORY_CARE_PROVIDER_SITE_OTHER): Payer: 59 | Admitting: Sports Medicine

## 2014-11-02 VITALS — BP 116/72 | HR 67 | Ht 62.0 in | Wt 127.0 lb

## 2014-11-02 DIAGNOSIS — G5601 Carpal tunnel syndrome, right upper limb: Secondary | ICD-10-CM

## 2014-11-02 DIAGNOSIS — M5412 Radiculopathy, cervical region: Secondary | ICD-10-CM | POA: Diagnosis not present

## 2014-11-02 MED ORDER — PREGABALIN 50 MG PO CAPS: ORAL_CAPSULE | ORAL | Status: DC

## 2014-11-02 NOTE — Progress Notes (Signed)
Subjective:    I'm seeing this patient as a consultation for:  Mary GawJade Breeback, PA-C, Dr. Nani Gasseratherine Metheney.  CC: Right hand pain  HPI: This is a pleasant 39 year old female, she works in data entry. For the past several months she's had increasing pain that she localizes along the volar aspect of her right hand with shooting pains into her middle finger, both on the volar and dorsal aspects. She did nighttime splinting for over a month without any improvement, more recently she had a nerve conduction study and electromyography that showed what appeared to be a C7 cervical radiculopathy as well as median neuropathy at the wrist suggestive of carpal tunnel syndrome. NSAIDs have been ineffective and gabapentin has been ineffective as well. She is referred to me for further evaluation and likely interventional procedural treatment. She denies much neck pain.  Past medical history, Surgical history, Family history not pertinant except as noted below, Social history, Allergies, and medications have been entered into the medical record, reviewed, and no changes needed.   Review of Systems: No headache, visual changes, nausea, vomiting, diarrhea, constipation, dizziness, abdominal pain, skin rash, fevers, chills, night sweats, weight loss, swollen lymph nodes, body aches, joint swelling, muscle aches, chest pain, shortness of breath, mood changes, visual or auditory hallucinations.   Objective:   General: Well Developed, well nourished, and in no acute distress.  Neuro/Psych: Alert and oriented x3, extra-ocular muscles intact, able to move all 4 extremities, sensation grossly intact. Skin: Warm and dry, no rashes noted.  Respiratory: Not using accessory muscles, speaking in full sentences, trachea midline.  Cardiovascular: Pulses palpable, no extremity edema. Abdomen: Does not appear distended. Right Wrist: Inspection normal with no visible erythema or swelling. ROM smooth and normal with good  flexion and extension and ulnar/radial deviation that is symmetrical with opposite wrist. Palpation is normal over metacarpals, navicular, lunate, and TFCC; tendons without tenderness/ swelling There was discrete tenderness over the second dorsal interosseous muscle with reproduction of pain on resisted abduction and adduction of the second and third fingers. There is also tenderness discretely over the extensor digitorum tendon to the middle finger, this is reproduced with resisted extension and flexion. No snuffbox tenderness. No tenderness over Canal of Guyon. Strength 5/5 in all directions without pain. Negative Finkelstein sign, positive Tinel's sign, positive Phalen sign Negative Watson's test.  Procedure: Real-time Ultrasound Guided Injection of right median nerve hydrodissection Device: GE Logiq E  Verbal informed consent obtained.  Time-out conducted.  Noted no overlying erythema, induration, or other signs of local infection.  Skin prepped in a sterile fashion.  Local anesthesia: Topical Ethyl chloride.  With sterile technique and under real time ultrasound guidance:  Using a 25-gauge needle I injected medicine both superficial to and deep to the median nerve in the carpal tunnel free from surrounding structures, the needle was redirected, and taking care to avoid any intraneural injection the needle was directed deeper into the carpal tunnel and more medication was injected. I injected a total of 1 mL Kenalog 40 and 5 mL lidocaine. Completed without difficulty  Pain immediately resolved suggesting accurate placement of the the patient had good resolution of some of her pain but not all of it. Advised to call if fevers/chills, erythema, induration, drainage, or persistent bleeding.  Images permanently stored and available for review in the ultrasound unit.  Impression: Technically successful ultrasound guided injection.  Cervical spine x-rays do show multilevel degenerative disc  disease worse at the C6-C7 level. Hand x-rays are unremarkable.  Impression and Recommendations:   This case required medical decision making of moderate complexity.

## 2014-11-02 NOTE — Assessment & Plan Note (Signed)
We are going to keep an eye on this, she does have cervical degenerative disc disease however symptoms at this point are most likely related to carpal tunnel syndrome plus/minus third MCP synovitis and extensor digitorum tendinitis.

## 2014-11-02 NOTE — Assessment & Plan Note (Signed)
Clinically this predominantly represents carpal tunnel syndrome with a positive Tinel's sign. There is likely an element of extensor digitorum tendinitis as well as third metacarpal phalangeal joint synovitis. In an effort to treat only one thing at a time as not to confuse the diagnosis, we did an ultrasound guided right-sided median nerve hydrodissection today. Continue nighttime splinting. Return in one month, if persistent symptoms we will injected the right third extensor digitorum tendon sheath as well as probably the metacarpophalangeal joint as well. Typically carpal tunnel syndrome should not cause dorsal hand symptoms. Adding some Lyrica samples.

## 2014-11-23 ENCOUNTER — Encounter: Payer: Self-pay | Admitting: Sports Medicine

## 2014-11-23 ENCOUNTER — Ambulatory Visit (INDEPENDENT_AMBULATORY_CARE_PROVIDER_SITE_OTHER): Payer: 59 | Admitting: Sports Medicine

## 2014-11-23 VITALS — BP 124/75 | HR 76 | Ht 62.0 in | Wt 122.0 lb

## 2014-11-23 DIAGNOSIS — M5412 Radiculopathy, cervical region: Secondary | ICD-10-CM | POA: Diagnosis not present

## 2014-11-23 DIAGNOSIS — G5601 Carpal tunnel syndrome, right upper limb: Secondary | ICD-10-CM

## 2014-11-23 NOTE — Assessment & Plan Note (Signed)
Confirmed on electromyography. At this point we are going to proceed with a cervical spine MRI in anticipation of intervention on the right at the C6-C7 level. Return to go over MRI results.

## 2014-11-23 NOTE — Progress Notes (Signed)
  Subjective:    CC: Follow-up  HPI: Right carpal tunnel syndrome: All volar symptoms have resolved after median nerve hydrodissection at the last visit.  Right cervical radiculopathy: C7 EMG confirmed, persistent symptoms.  Past medical history, Surgical history, Family history not pertinant except as noted below, Social history, Allergies, and medications have been entered into the medical record, reviewed, and no changes needed.   Review of Systems: No fevers, chills, night sweats, weight loss, chest pain, or shortness of breath.   Objective:    General: Well Developed, well nourished, and in no acute distress.  Neuro: Alert and oriented x3, extra-ocular muscles intact, sensation grossly intact.  HEENT: Normocephalic, atraumatic, pupils equal round reactive to light, neck supple, no masses, no lymphadenopathy, thyroid nonpalpable.  Skin: Warm and dry, no rashes. Cardiac: Regular rate and rhythm, no murmurs rubs or gallops, no lower extremity edema.  Respiratory: Clear to auscultation bilaterally. Not using accessory muscles, speaking in full sentences. Neck: Negative spurling's Full neck range of motion Grip strength and sensation normal in bilateral hands Strength good C4 to T1 distribution No sensory change to C4 to T1 Reflexes normal  Impression and Recommendations:

## 2014-11-23 NOTE — Assessment & Plan Note (Signed)
Carpal tunnel symptoms have resolved after median nerve hydrodissection under ultrasound guidance at the last visit.

## 2014-11-29 ENCOUNTER — Other Ambulatory Visit: Payer: 59

## 2014-11-30 ENCOUNTER — Ambulatory Visit: Payer: 59 | Admitting: Sports Medicine

## 2014-12-01 ENCOUNTER — Ambulatory Visit (HOSPITAL_COMMUNITY)
Admission: RE | Admit: 2014-12-01 | Discharge: 2014-12-01 | Disposition: A | Discharge status: Home / Self Care | Payer: 59 | Source: Ambulatory Visit | Attending: Sports Medicine | Admitting: Sports Medicine

## 2014-12-01 DIAGNOSIS — G542 Cervical root disorders, not elsewhere classified: Secondary | ICD-10-CM | POA: Diagnosis not present

## 2014-12-01 DIAGNOSIS — M5022 Other cervical disc displacement, mid-cervical region: Secondary | ICD-10-CM | POA: Diagnosis not present

## 2014-12-01 DIAGNOSIS — M5412 Radiculopathy, cervical region: Secondary | ICD-10-CM | POA: Diagnosis present

## 2014-12-08 ENCOUNTER — Ambulatory Visit (INDEPENDENT_AMBULATORY_CARE_PROVIDER_SITE_OTHER): Payer: 59 | Admitting: Sports Medicine

## 2014-12-08 ENCOUNTER — Encounter: Payer: Self-pay | Admitting: Sports Medicine

## 2014-12-08 VITALS — BP 119/74 | HR 74 | Ht 62.0 in | Wt 121.0 lb

## 2014-12-08 DIAGNOSIS — G43009 Migraine without aura, not intractable, without status migrainosus: Secondary | ICD-10-CM

## 2014-12-08 DIAGNOSIS — F411 Generalized anxiety disorder: Secondary | ICD-10-CM | POA: Diagnosis not present

## 2014-12-08 DIAGNOSIS — M5412 Radiculopathy, cervical region: Secondary | ICD-10-CM | POA: Diagnosis not present

## 2014-12-08 DIAGNOSIS — G43909 Migraine, unspecified, not intractable, without status migrainosus: Secondary | ICD-10-CM | POA: Insufficient documentation

## 2014-12-08 MED ORDER — BUPROPION HCL ER (XL) 300 MG PO TB24: 300.0000 mg | ORAL_TABLET | Freq: Every day | ORAL | Status: DC

## 2014-12-08 MED ORDER — RIZATRIPTAN BENZOATE 10 MG PO TBDP: 10.0000 mg | ORAL_TABLET | ORAL | Status: DC | PRN

## 2014-12-08 MED ORDER — TOPIRAMATE 50 MG PO TABS: ORAL_TABLET | ORAL | Status: DC

## 2014-12-08 NOTE — Assessment & Plan Note (Signed)
Complex migraines with hemifacial anesthesia, aura, followed by headache. At this point we are going to do both preventative and abortive treatments. Topamax in an up taper as well as Maxalt oral dissolving tablet. Mary Faulkner will also keep a diary as to the number of migraines, currently she has about 2 per month.

## 2014-12-08 NOTE — Assessment & Plan Note (Signed)
Overall well controlled on Wellbutrin. Next line refilling medication.

## 2014-12-08 NOTE — Assessment & Plan Note (Signed)
EMG and nerve conduction confirmed right-sided C7 cervical radiculopathy. Unfortunately most has failed all conservative measures so at this point we are going to proceed with a right-sided C6-C7 interlaminar epidural. MRI also confirmed a C6-C7 disc protrusion as expected. Next line return to see me 3 weeks after the injection to evaluate response.

## 2014-12-08 NOTE — Progress Notes (Signed)
  Subjective:    CC: MRI results  HPI:  This is exquisitely pleasant 39 year old female, we were able to completely resolve her carpal tunnel syndromes after a right-sided median nerve hydrodissection, unfortunately she continued to have some pain referable to the right C7 nerve root with neck pain. A subsequent EMG and nerve conduction study did show evidence of C7 radiculopathy, and an MRI done recently shows a C6-C7 disc protrusion that does appear to contact the right C7 nerve root. At this point she has failed oral medications and formal physical therapy as well as neuropathic-type agents.  Headaches: Temporal, present about twice per month, the last for hours to days, associated with nausea, photophobia, phonophobia, and hemifacial numbness that resolved. She does get a prior aura. She does have a history of migraine headaches. Was wondering if this is coming from her neck. She's not on any current abortive or preventative treatment.  Past medical history, Surgical history, Family history not pertinant except as noted below, Social history, Allergies, and medications have been entered into the medical record, reviewed, and no changes needed.   Review of Systems: No fevers, chills, night sweats, weight loss, chest pain, or shortness of breath.   Objective:    General: Well Developed, well nourished, and in no acute distress.  Neuro: Alert and oriented x3, extra-ocular muscles intact, sensation grossly intact.  HEENT: Normocephalic, atraumatic, pupils equal round reactive to light, neck supple, no masses, no lymphadenopathy, thyroid nonpalpable.  Skin: Warm and dry, no rashes. Cardiac: Regular rate and rhythm, no murmurs rubs or gallops, no lower extremity edema.  Respiratory: Clear to auscultation bilaterally. Not using accessory muscles, speaking in full sentences.  MRI was personally reviewed, and it does show a C6-C7 disc protrusion of moderate size. This protrusion could certainly  affect the exiting C7 nerve root on the right side.  Impression and Recommendations:

## 2014-12-13 ENCOUNTER — Ambulatory Visit
Admission: RE | Admit: 2014-12-13 | Discharge: 2014-12-13 | Disposition: A | Discharge status: Home / Self Care | Payer: 59 | Source: Ambulatory Visit | Attending: Sports Medicine | Admitting: Sports Medicine

## 2014-12-13 VITALS — BP 118/71 | HR 79

## 2014-12-13 DIAGNOSIS — M5412 Radiculopathy, cervical region: Secondary | ICD-10-CM

## 2014-12-13 MED ORDER — IOHEXOL 300 MG/ML  SOLN
1.0000 mL | Freq: Once | INTRAMUSCULAR | Status: AC | PRN
  Administered 2014-12-13: 1 mL via EPIDURAL

## 2014-12-13 MED ORDER — TRIAMCINOLONE ACETONIDE 40 MG/ML IJ SUSP (RADIOLOGY)
60.0000 mg | Freq: Once | INTRAMUSCULAR | Status: AC
  Administered 2014-12-13: 60 mg via EPIDURAL

## 2014-12-13 NOTE — Discharge Instructions (Signed)

## 2014-12-14 ENCOUNTER — Ambulatory Visit: Payer: 59 | Admitting: Sports Medicine

## 2014-12-14 ENCOUNTER — Encounter: Payer: Self-pay | Admitting: *Deleted

## 2014-12-14 ENCOUNTER — Emergency Department
Admission: EM | Admit: 2014-12-14 | Discharge: 2014-12-14 | Disposition: A | Discharge status: Home / Self Care | Payer: 59 | Source: Home / Self Care | Attending: Family Medicine | Admitting: Family Medicine

## 2014-12-14 DIAGNOSIS — R52 Pain, unspecified: Secondary | ICD-10-CM

## 2014-12-14 DIAGNOSIS — J029 Acute pharyngitis, unspecified: Secondary | ICD-10-CM

## 2014-12-14 DIAGNOSIS — M5489 Other dorsalgia: Secondary | ICD-10-CM | POA: Diagnosis not present

## 2014-12-14 LAB — POCT INFLUENZA A/B
INFLUENZA A, POC: NEGATIVE
Influenza B, POC: NEGATIVE

## 2014-12-14 LAB — POCT RAPID STREP A (OFFICE): Rapid Strep A Screen: NEGATIVE

## 2014-12-14 NOTE — ED Notes (Signed)
Pt c/o body aches, fatigue and HA x 2 days. Epidural injection to back performed 12/13/14.

## 2014-12-14 NOTE — ED Provider Notes (Addendum)
CSN: [id number]     Arrival date & time 12/14/14  1702 History   First MD Initiated Contact with Patient 12/14/14 1703     Chief Complaint  Patient presents with  . Generalized Body Aches  . Fatigue   HPI Patient presents today with chief complaint of generalized body aches and fatigue. Patient was have an epidural steroidal injection of her cervical spine yesterday. States that she's had persistent low back pain as well as generalized fatigue since this point. Denies any fevers or chills. Reports that pain is consistent with prior episodes of pain in the past. Pain is not significantly worse and this seems to be more persistent. Denies any recent outdoor exposures. No known tick bites. No known sick contacts. Up-to-date on flu shot. No nausea or vomiting. No abdominal pain. No diarrhea. No dysuria. No bowel or bladder anesthesia. He is able to ambulate. Patient discussed case with the PCP as well as [facility] who performed epidural steroidal injection. Recommend follow-up to rule out any type of infectious process causing symptoms. History reviewed. No pertinent past medical history. Past Surgical History  Procedure Laterality Date  . Abdominal hysterectomy    . Cesarean section    . Cholecystectomy     Family History  Problem Relation Age of Onset  . Hyperlipidemia Father   . Hypertension Father   . Hyperlipidemia Brother   . Cancer Paternal Grandmother   . Alcohol abuse Paternal Grandfather   . Cancer Paternal Grandfather    History  Substance Use Topics  . Smoking status: Never Smoker   . Smokeless tobacco: Not on file  . Alcohol Use: Yes   OB History    No data available     Review of Systems  All other systems reviewed and are negative.   Allergies  Morphine and related and Sulfa antibiotics  Home Medications   Prior to Admission medications   Medication Sig Start Date End Date Taking? Authorizing Provider  ALPRAZolam Prudy Feeler) 0.5 MG tablet Take 1 tablet  (0.5 mg total) by mouth 2 (two) times daily as needed for anxiety. 06/11/14   Jade L Breeback, PA-C  buPROPion (WELLBUTRIN XL) 300 MG 24 hr tablet Take 1 tablet (300 mg total) by mouth daily. 12/08/14   Monica Becton, MD  Linaclotide Karlene Einstein) 290 MCG CAPS capsule Take 1 capsule (290 mcg total) by mouth daily. 07/28/14   Jade L Breeback, PA-C  meloxicam (MOBIC) 15 MG tablet Take 1 tablet (15 mg total) by mouth daily. 09/23/14   Jade L Breeback, PA-C  pantoprazole (PROTONIX) 40 MG tablet Take 1 tablet (40 mg total) by mouth 2 (two) times daily before a meal. 06/11/14   Jade L Breeback, PA-C  pregabalin (LYRICA) 50 MG capsule 1 tab twice a day for a week then 1 tablet 3 times a day 11/02/14   Monica Becton, MD  rizatriptan (MAXALT-MLT) 10 MG disintegrating tablet Take 1 tablet (10 mg total) by mouth as needed for migraine. May repeat in 2 hours if needed 12/08/14   Monica Becton, MD  sucralfate (CARAFATE) 1 G tablet Take 1 tablet (1 g total) by mouth 4 (four) times daily. 06/11/14   Jade L Breeback, PA-C  topiramate (TOPAMAX) 50 MG tablet One half tab by mouth daily for one week then one tab by mouth daily. 12/08/14   Monica Becton, MD  traMADol (ULTRAM) 50 MG tablet Take 1 tablet (50 mg total) by mouth every 6 (six) hours as needed. 09/20/14  Jade L Breeback, PA-C   BP 117/73 mmHg  Pulse 82  Temp(Src) 98.3 F (36.8 C) (Oral)  Resp 16  Ht  (1.575 m)  Wt 122 lb (55.339 kg)  BMI 22.31 kg/m2  SpO2 97% Physical Exam  Constitutional: She appears well-developed and well-nourished.  HENT:  Head: Normocephalic and atraumatic.  Eyes: Conjunctivae are normal. Pupils are equal, round, and reactive to light.  Neck: Normal range of motion. Neck supple.  Cardiovascular: Normal rate and regular rhythm.   Pulmonary/Chest: Effort normal.  Abdominal: Soft.  Musculoskeletal: Normal range of motion.  Neurological: She is alert.  Skin: Skin is warm.    ED Course  Procedures  (including critical care time) Labs Review Labs Reviewed  POCT INFLUENZA A/B  POCT RAPID STREP A (OFFICE)    Imaging Review Dg Epidurography  12/13/2014   CLINICAL DATA:  Cervical spondylosis without myelopathy. C6-C7 disc protrusion. RIGHT C7 radiculopathy.  EXAM: CERVICAL INTERLAMINAR EPIDURAL INJECTION  FLUOROSCOPY TIME:  0 minutes 40 seconds  Dose area product: 33.4 sexuGy*m^2  PROCEDURE: Informed verbal written consent was obtained on the first visit. Verbal consent was obtained by Dr. Carlota Raspberry before the procedure. The patient was placed prone on the fluoroscopic table, the C7-T1 interspace was visualized, and the skin was marked. General risks include bleeding, infection, injury to nerves, blood vessels, and adjacent structures as well as allergic reaction. Specific risks include thecal sac puncture, cord injury, paralysis, up to and including death. We discussed the moderate likelihood of moderate lasting relief/attainment of therapeutic goal. Time out form was completed.  After thorough povidone-iodine scrubbing, the site was draped in sterile fashion. 1% lidocaine was used to anesthetize the skin and deeper soft tissues. An interlaminar approach was performed on the RIGHT at C7-T1 (no epidural space at C6-C7). A 20 gauge Tuohy epidural needle was advanced using loss-of-resistance technique. The epidural space was identified on first pass without evidence of blood, cerebrospinal fluid, or paresthesias. There was no intravascular or subarachnoid opacification.  Injection of Omnipaque 300 shows a good epidural pattern with spread above and below the level of needle placement, primarily on the RIGHT. No vascular opacification is seen. 1.5 ml of Kenalog 40 (60 mg) and 2 ml of normal saline were then instilled.  The procedure was well-tolerated. The patient was able to ambulate to the recovery area and observed for an appropriate length of time as outlined on the nursing notes. The patient was discharged  home with a driver in good condition with detailed patient instructions.  IMPRESSION: Technically successful first epidural injection on the RIGHT at C7-T1.   Electronically Signed   By: Andreas Newport M.D.   On: 12/13/2014 14:57     MDM   1. Acute pharyngitis, unspecified pharyngitis type   2. Body aches   3. Other back pain    Rapid strep and rapid flu negative. Suspect this may be a possible postprocedural issue with secondary pain. Patient does have a fair amount of pain around the injection site over the cervical region. No significant erythema concerning for infection. Neurovascularly intact distally. We'll send off a strep culture Discussed symptomatic care. Patient feels she has adequate pain medication at home. Discuss that symptoms fail to improve or worsen over the next 24 hours to follow-up with her PCP or go to the ER. Patient expressed understanding of plan. Is agreeable.     The patient and/or caregiver has been counseled thoroughly with regard to treatment plan and/or medications prescribed including dosage, schedule,  interactions, rationale for use, and possible side effects and they verbalize understanding. Diagnoses and expected course of recovery discussed and will return if not improved as expected or if the condition worsens. Patient and/or caregiver verbalized understanding.          Floydene FlockSteven J Marlow Hendrie, MD 12/14/14 1814  Floydene FlockSteven J Claborn Janusz, MD 12/14/14 [phone]

## 2014-12-15 ENCOUNTER — Emergency Department (HOSPITAL_BASED_OUTPATIENT_CLINIC_OR_DEPARTMENT_OTHER)
Admission: EM | Admit: 2014-12-15 | Discharge: 2014-12-15 | Disposition: A | Discharge status: Home / Self Care | Payer: 59 | Attending: Emergency Medicine | Admitting: Emergency Medicine

## 2014-12-15 ENCOUNTER — Emergency Department (HOSPITAL_BASED_OUTPATIENT_CLINIC_OR_DEPARTMENT_OTHER): Payer: 59

## 2014-12-15 ENCOUNTER — Encounter (HOSPITAL_BASED_OUTPATIENT_CLINIC_OR_DEPARTMENT_OTHER): Payer: Self-pay | Admitting: *Deleted

## 2014-12-15 DIAGNOSIS — R51 Headache: Secondary | ICD-10-CM | POA: Insufficient documentation

## 2014-12-15 DIAGNOSIS — R509 Fever, unspecified: Secondary | ICD-10-CM | POA: Insufficient documentation

## 2014-12-15 DIAGNOSIS — Z79899 Other long term (current) drug therapy: Secondary | ICD-10-CM | POA: Diagnosis not present

## 2014-12-15 DIAGNOSIS — M25561 Pain in right knee: Secondary | ICD-10-CM | POA: Diagnosis not present

## 2014-12-15 DIAGNOSIS — M545 Low back pain: Secondary | ICD-10-CM | POA: Diagnosis not present

## 2014-12-15 DIAGNOSIS — M25562 Pain in left knee: Secondary | ICD-10-CM | POA: Diagnosis not present

## 2014-12-15 DIAGNOSIS — M549 Dorsalgia, unspecified: Secondary | ICD-10-CM

## 2014-12-15 DIAGNOSIS — Z9889 Other specified postprocedural states: Secondary | ICD-10-CM | POA: Diagnosis not present

## 2014-12-15 LAB — SEDIMENTATION RATE: SED RATE: 2 mm/h (ref 0–22)

## 2014-12-15 LAB — URINALYSIS, ROUTINE W REFLEX MICROSCOPIC
Bilirubin Urine: NEGATIVE
GLUCOSE, UA: NEGATIVE mg/dL
HGB URINE DIPSTICK: NEGATIVE
KETONES UR: NEGATIVE mg/dL
Leukocytes, UA: NEGATIVE
NITRITE: NEGATIVE
Protein, ur: NEGATIVE mg/dL
SPECIFIC GRAVITY, URINE: 1.013 (ref 1.005–1.030)
Urobilinogen, UA: 0.2 mg/dL (ref 0.0–1.0)
pH: 6.5 (ref 5.0–8.0)

## 2014-12-15 LAB — COMPREHENSIVE METABOLIC PANEL
ALBUMIN: 4.5 g/dL (ref 3.5–5.0)
ALK PHOS: 79 U/L (ref 38–126)
ALT: 19 U/L (ref 14–54)
ANION GAP: 6 (ref 5–15)
AST: 18 U/L (ref 15–41)
BUN: 16 mg/dL (ref 6–20)
CO2: 24 mmol/L (ref 22–32)
Calcium: 9.5 mg/dL (ref 8.9–10.3)
Chloride: 106 mmol/L (ref 101–111)
Creatinine, Ser: 0.82 mg/dL (ref 0.44–1.00)
GFR calc Af Amer: >60 mL/min (ref >60)
GFR calc non Af Amer: >60 mL/min (ref >60)
Glucose, Bld: 93 mg/dL (ref 65–99)
Potassium: 3.6 mmol/L (ref 3.5–5.1)
SODIUM: 136 mmol/L (ref 135–145)
Total Bilirubin: 0.3 mg/dL (ref 0.3–1.2)
Total Protein: 7.6 g/dL (ref 6.5–8.1)

## 2014-12-15 LAB — STREP A DNA PROBE: GASP: NEGATIVE

## 2014-12-15 LAB — CBC WITH DIFFERENTIAL/PLATELET
BASOS ABS: 0 10*3/uL (ref 0.0–0.1)
Basophils Relative: 0 % (ref 0–1)
EOS PCT: 0 % (ref 0–5)
Eosinophils Absolute: 0 10*3/uL (ref 0.0–0.7)
HEMATOCRIT: 46.9 % — AB (ref 36.0–46.0)
Hemoglobin: 16 g/dL — ABNORMAL HIGH (ref 12.0–15.0)
LYMPHS ABS: 3.2 10*3/uL (ref 0.7–4.0)
Lymphocytes Relative: 18 % (ref 12–46)
MCH: 29.3 pg (ref 26.0–34.0)
MCHC: 34.1 g/dL (ref 30.0–36.0)
MCV: 85.9 fL (ref 78.0–100.0)
Monocytes Absolute: 1.3 10*3/uL — ABNORMAL HIGH (ref 0.1–1.0)
Monocytes Relative: 7 % (ref 3–12)
NEUTROS ABS: 12.9 10*3/uL — AB (ref 1.7–7.7)
Neutrophils Relative %: 75 % (ref 43–77)
Platelets: 329 10*3/uL (ref 150–400)
RBC: 5.46 MIL/uL — ABNORMAL HIGH (ref 3.87–5.11)
RDW: 13.9 % (ref 11.5–15.5)
WBC: 17.4 10*3/uL — ABNORMAL HIGH (ref 4.0–10.5)

## 2014-12-15 LAB — CK: Total CK: 31 U/L — ABNORMAL LOW (ref 38–234)

## 2014-12-15 MED ORDER — FENTANYL CITRATE (PF) 100 MCG/2ML IJ SOLN
50.0000 ug | Freq: Once | INTRAMUSCULAR | Status: AC
  Administered 2014-12-15: 50 ug via INTRAVENOUS
  Filled 2014-12-15: qty 2

## 2014-12-15 MED ORDER — FENTANYL CITRATE (PF) 100 MCG/2ML IJ SOLN: 50.0000 ug | INTRAMUSCULAR | Status: DC | PRN

## 2014-12-15 MED ORDER — KETOROLAC TROMETHAMINE 30 MG/ML IJ SOLN
30.0000 mg | Freq: Once | INTRAMUSCULAR | Status: AC
  Administered 2014-12-15: 30 mg via INTRAVENOUS
  Filled 2014-12-15: qty 1

## 2014-12-15 MED ORDER — ONDANSETRON HCL 4 MG/2ML IJ SOLN
4.0000 mg | Freq: Once | INTRAMUSCULAR | Status: AC
  Administered 2014-12-15: 4 mg via INTRAVENOUS
  Filled 2014-12-15: qty 2

## 2014-12-15 MED ORDER — HYDROCODONE-ACETAMINOPHEN 5-325 MG PO TABS: 1.0000 | ORAL_TABLET | ORAL | Status: DC | PRN

## 2014-12-15 MED ORDER — SODIUM CHLORIDE 0.9 % IV BOLUS (SEPSIS)
1000.0000 mL | Freq: Once | INTRAVENOUS | Status: AC
  Administered 2014-12-15: 1000 mL via INTRAVENOUS

## 2014-12-15 MED ORDER — NAPROXEN 500 MG PO TABS: 500.0000 mg | ORAL_TABLET | Freq: Two times a day (BID) | ORAL | Status: DC

## 2014-12-15 MED ORDER — FENTANYL CITRATE (PF) 100 MCG/2ML IJ SOLN
50.0000 ug | INTRAMUSCULAR | Status: DC | PRN
  Administered 2014-12-15: 50 ug via INTRAVENOUS
  Filled 2014-12-15: qty 2

## 2014-12-15 NOTE — ED Notes (Signed)
MD at bedside. 

## 2014-12-15 NOTE — ED Provider Notes (Signed)
CSN: [id number]     Arrival date & time 12/15/14  1645 History   First MD Initiated Contact with Patient 12/15/14 1700     No chief complaint on file.     HPI  Patient presents for evaluation of body aches including headache back pain and knee pain. Had a history of a right C7 radiculopathy confirmed with EMG and MRI. Underwent cervical fluoroscopic guided epidural sterile injection 2 days ago. He said helped body including headache and backache. No fever shakes or chills. No respiratory complaints. No GI complaints. That she feels "like I got the flu". Had a negative strep and flu swab yesterday at urgent care. Has no numbness weakness or tingling to her legs. She does not have neck pain or stiffness or right upper chin the symptoms. She states these have resolved.  He has had a history of back pain. She has a history of headaches. States these do feel typical for headache and back pain she has had in the past.  History reviewed. No pertinent past medical history. Past Surgical History  Procedure Laterality Date  . Abdominal hysterectomy    . Cesarean section    . Cholecystectomy     Family History  Problem Relation Age of Onset  . Hyperlipidemia Father   . Hypertension Father   . Hyperlipidemia Brother   . Cancer Paternal Grandmother   . Alcohol abuse Paternal Grandfather   . Cancer Paternal Grandfather    History  Substance Use Topics  . Smoking status: Never Smoker   . Smokeless tobacco: Not on file  . Alcohol Use: Yes   OB History    No data available     Review of Systems  Constitutional: Positive for fever. Negative for chills, diaphoresis, appetite change and fatigue.  HENT: Negative for mouth sores, sore throat and trouble swallowing.   Eyes: Negative for visual disturbance.  Respiratory: Negative for cough, chest tightness, shortness of breath and wheezing.   Cardiovascular: Negative for chest pain.  Gastrointestinal: Negative for nausea, vomiting, abdominal  pain, diarrhea and abdominal distention.  Endocrine: Negative for polydipsia, polyphagia and polyuria.  Genitourinary: Negative for dysuria, frequency and hematuria.  Musculoskeletal: Positive for back pain and arthralgias. Negative for gait problem.  Skin: Negative for color change, pallor and rash.  Neurological: Negative for dizziness, syncope, light-headedness and headaches.  Hematological: Does not bruise/bleed easily.  Psychiatric/Behavioral: Negative for behavioral problems and confusion.      Allergies  Morphine and related and Sulfa antibiotics  Home Medications   Prior to Admission medications   Medication Sig Start Date End Date Taking? Authorizing Provider  ALPRAZolam Prudy Feeler) 0.5 MG tablet Take 1 tablet (0.5 mg total) by mouth 2 (two) times daily as needed for anxiety. 06/11/14  Yes Jade L Breeback, PA-C  buPROPion (WELLBUTRIN XL) 300 MG 24 hr tablet Take 1 tablet (300 mg total) by mouth daily. 12/08/14  Yes Monica Becton, MD  Linaclotide [facility]) 290 MCG CAPS capsule Take 1 capsule (290 mcg total) by mouth daily. 07/28/14  Yes Jade L Breeback, PA-C  rizatriptan (MAXALT-MLT) 10 MG disintegrating tablet Take 1 tablet (10 mg total) by mouth as needed for migraine. May repeat in 2 hours if needed 12/08/14  Yes Monica Becton, MD  topiramate (TOPAMAX) 50 MG tablet One half tab by mouth daily for one week then one tab by mouth daily. 12/08/14  Yes Monica Becton, MD  HYDROcodone-acetaminophen (NORCO/VICODIN) 5-325 MG per tablet Take 1 tablet by mouth every 4 (  four) hours as needed. 12/15/14   Rolland Porter, MD  naproxen (NAPROSYN) 500 MG tablet Take 1 tablet (500 mg total) by mouth 2 (two) times daily. 12/15/14   Rolland Porter, MD   BP 123/68 mmHg  Pulse 77  Temp(Src) 98.2 F (36.8 C) (Oral)  Resp 18  Ht  (1.575 m)  Wt 122 lb (55.339 kg)  BMI 22.31 kg/m2  SpO2 100% Physical Exam  Constitutional: She is oriented to person, place, and time. She appears  well-developed and well-nourished. No distress.  HENT:  Head: Normocephalic.  HEENT exam normal.  Eyes: Conjunctivae are normal. Pupils are equal, round, and reactive to light. No scleral icterus.  Neck: Normal range of motion. Neck supple. No thyromegaly present.  X supple. No meningismus. Denies any symptoms to her upper extremities with neck movement including flexion extension, and Spurling's maneuvers.  Cardiovascular: Normal rate and regular rhythm.  Exam reveals no gallop and no friction rub.   No murmur heard. Pulmonary/Chest: Effort normal and breath sounds normal. No respiratory distress. She has no wheezes. She has no rales.  Abdominal: Soft. Bowel sounds are normal. She exhibits no distension. There is no tenderness. There is no rebound.  Musculoskeletal: Normal range of motion.       Back:  Neurological: She is alert and oriented to person, place, and time.  . Normal symmetric strength to flex/.extend hip and knees, dorsi/plantar flex ankles. Normal symmetric sensation to all distributions to LEs Patellar and achilles reflexes 1-2+. Downgoing Babinski   Skin: Skin is warm and dry. No rash noted.  Psychiatric: She has a normal mood and affect. Her behavior is normal.    ED Course  Procedures (including critical care time) Labs Review Labs Reviewed  CBC WITH DIFFERENTIAL/PLATELET - Abnormal; Notable for the following:    WBC 17.4 (*)    RBC 5.46 (*)    Hemoglobin 16.0 (*)    HCT 46.9 (*)    Neutro Abs 12.9 (*)    Monocytes Absolute 1.3 (*)    All other components within normal limits  CK - Abnormal; Notable for the following:    Total CK 31 (*)    All other components within normal limits  COMPREHENSIVE METABOLIC PANEL  URINALYSIS, ROUTINE W REFLEX MICROSCOPIC (NOT AT [facility])  SEDIMENTATION RATE    Imaging Review Dg Chest 2 View  12/15/2014   CLINICAL DATA:  39 year old female with recent steroid injection in the neck on Monday of this week. Low back pain and  aching in the knees. Headache.  EXAM: CHEST  2 VIEW  COMPARISON:  Chest x-ray 06/03/2014.  FINDINGS: Lung volumes are normal. No consolidative airspace disease. No pleural effusions. No pneumothorax. No pulmonary nodule or mass noted. Pulmonary vasculature and the cardiomediastinal silhouette are within normal limits.  IMPRESSION: No radiographic evidence of acute cardiopulmonary disease.   Electronically Signed   By: Trudie Reed M.D.   On: 12/15/2014 17:55     EKG Interpretation None      MDM   Final diagnoses:  Back pain    She given Toradol and IV pain medication. Her symptoms have improved. WBC is 17,000 without left shift or bands. Normal sedimentation rate and CPK only 31.  Person at Center's have resolved after her epidural steroidal injection. The symptoms started almost immediately. However they are not consistent with spinal headache as she is not positionally improve her laying supine. She does not have neurological findings or symptoms. Her pain is limited to her knees  and paraspinal musculature of her back. She does not present with symptoms or findings that would suggest myelopathy. She is not febrile at home historically, or here. I think she is appropriate for rest fluid hydration anti-inflammatory supple pain control. If told her of the leukocytosis. If she develops temperature over 100, rash, localizing neurological signs such as weakness or pain or numbness into one leg. If she develops neck pain or stiffness or any rash and asked her to recheck. She expressed understanding.    Rolland PorterMark Bertine Schlottman, MD 12/15/14 [phone]

## 2014-12-15 NOTE — ED Notes (Signed)
Patient transported to X-ray 

## 2014-12-15 NOTE — ED Notes (Addendum)
C/o steriod injection to neck on Monday and started having low back pain and aching in knees. C/o h/a. No n/v. Feels very tired and sleepy.

## 2014-12-15 NOTE — Discharge Instructions (Signed)
[facility] if you develop weakness, or numbness into one or both legs, or pain or stiffness in your neck. Recheck if you develop temperature greater than 100, or rash of any kind.  Recheck with any worsening or new developing symptoms. Your labs today showed an elevated white blood cell count, however you did not have fever.   New prescription today for anti-inflammatory and pain medication.  Rest and push fluids.   Back Pain, Adult Back pain is very common. The pain often gets better over time. The cause of back pain is usually not dangerous. Most people can learn to manage their back pain on their own.  HOME CARE   Stay active. Start with short walks on flat ground if you can. Try to walk farther each day.  Do not sit, drive, or stand in one place for more than 30 minutes. Do not stay in bed.  Do not avoid exercise or work. Activity can help your back heal faster.  Be careful when you bend or lift an object. Bend at your knees, keep the object close to you, and do not twist.  Sleep on a firm mattress. Lie on your side, and bend your knees. If you lie on your back, put a pillow under your knees.  Only take medicines as told by your doctor.  Put ice on the injured area.  Put ice in a plastic bag.  Place a towel between your skin and the bag.  Leave the ice on for 15-20 minutes, 03-04 times a day for the first 2 to 3 days. After that, you can switch between ice and heat packs.  Ask your doctor about back exercises or massage.  Avoid feeling anxious or stressed. Find good ways to deal with stress, such as exercise. GET HELP RIGHT AWAY IF:   Your pain does not go away with rest or medicine.  Your pain does not go away in 1 week.  You have new problems.  You do not feel well.  The pain spreads into your legs.  You cannot control when you poop (bowel movement) or pee (urinate).  Your arms or legs feel weak or lose feeling (numbness).  You feel sick to  your stomach (nauseous) or throw up (vomit).  You have belly (abdominal) pain.  You feel like you may pass out (faint). MAKE SURE YOU:   Understand these instructions.  Will watch your condition.  Will get help right away if you are not doing well or get worse. Document Released: 12/12/2007 Document Revised: 09/17/2011 Document Reviewed: 10/27/2013 [facility] Patient Information 2015 [employer] [place]. This information is not intended to replace advice given to you by your health care provider. Make sure you discuss any questions you have with your health care provider.

## 2014-12-20 ENCOUNTER — Encounter: Payer: Self-pay | Admitting: Family Medicine

## 2014-12-20 ENCOUNTER — Ambulatory Visit (INDEPENDENT_AMBULATORY_CARE_PROVIDER_SITE_OTHER): Payer: Self-pay | Admitting: Family Medicine

## 2014-12-20 DIAGNOSIS — Z0289 Encounter for other administrative examinations: Secondary | ICD-10-CM

## 2014-12-20 NOTE — Progress Notes (Signed)
No show 12/20/2014

## 2015-04-12 ENCOUNTER — Encounter: Payer: Self-pay | Admitting: Sports Medicine

## 2015-04-12 ENCOUNTER — Ambulatory Visit (INDEPENDENT_AMBULATORY_CARE_PROVIDER_SITE_OTHER): Payer: BLUE CROSS/BLUE SHIELD | Admitting: Sports Medicine

## 2015-04-12 VITALS — BP 119/70 | HR 77 | Ht 62.0 in | Wt 122.0 lb

## 2015-04-12 DIAGNOSIS — G5601 Carpal tunnel syndrome, right upper limb: Secondary | ICD-10-CM

## 2015-04-12 NOTE — Progress Notes (Signed)
  Subjective:    CC: right carpal tunnel syndrome  HPI: This pleasant 39 year old female returns, she had a six-month response to a prior right median nerve hydrodissection, she desires a repeat today, symptoms are recurrent, moderate, persistent. Radiation in the hand and the median nerve distribution.  Past medical history, Surgical history, Family history not pertinant except as noted below, Social history, Allergies, and medications have been entered into the medical record, reviewed, and no changes needed.   Review of Systems: No fevers, chills, night sweats, weight loss, chest pain, or shortness of breath.   Objective:    General: Well Developed, well nourished, and in no acute distress.  Neuro: Alert and oriented x3, extra-ocular muscles intact, sensation grossly intact.  HEENT: Normocephalic, atraumatic, pupils equal round reactive to light, neck supple, no masses, no lymphadenopathy, thyroid nonpalpable.  Skin: Warm and dry, no rashes. Cardiac: Regular rate and rhythm, no murmurs rubs or gallops, no lower extremity edema.  Respiratory: Clear to auscultation bilaterally. Not using accessory muscles, speaking in full sentences.  Procedure: Real-time Ultrasound Guided Injection of right median nerve hydrodissection Device: GE Logiq E  Verbal informed consent obtained.  Time-out conducted.  Noted no overlying erythema, induration, or other signs of local infection.  Skin prepped in a sterile fashion.  Local anesthesia: Topical Ethyl chloride.  With sterile technique and under real time ultrasound guidance: Using a 25-gauge needle I injected medicine both superficial to and deep to the median nerve in the carpal tunnel free from surrounding structures, the needle was redirected, and taking care to avoid any intraneural injection the needle was directed deeper into the carpal tunnel and more medication was injected. I injected a total of 1 mL Kenalog 40 and 5 mL  lidocaine. Completed without difficulty  Pain immediately resolved suggesting accurate placement of the the patient had good resolution of some of her pain but not all of it. Advised to call if fevers/chills, erythema, induration, drainage, or persistent bleeding.  Images permanently stored and available for review in the ultrasound unit.  Impression: Technically successful ultrasound guided injection.  Impression and Recommendations:

## 2015-04-12 NOTE — Assessment & Plan Note (Signed)
Right median nerve hydrodissection as above, six-month response to the prior hydrodissection, return as needed.

## 2015-04-13 ENCOUNTER — Telehealth: Payer: Self-pay

## 2015-04-13 NOTE — Telephone Encounter (Signed)
Patient states she is unable to move her right hand because of shooting pain. She states she just received an injection yesterday. Please advise.

## 2015-04-13 NOTE — Telephone Encounter (Signed)
Patient called again requesting advise on what she should do regarding pain shooting down her right hand. Advise patient that after the numbing medicine  Wear off she will experience some pain until the antiinflammatory kick in. Please advise patient. Rhonda Cunningham,CMA

## 2015-04-13 NOTE — Telephone Encounter (Signed)
Yes, this is all normal, this is the nerve waking back up, ice 20 minutes 3-4 times a day and stick through it. It should be better today

## 2015-04-14 NOTE — Telephone Encounter (Signed)
Patient has been informed of the information below. Ashely Joshua,CMA

## 2015-05-19 ENCOUNTER — Ambulatory Visit (INDEPENDENT_AMBULATORY_CARE_PROVIDER_SITE_OTHER): Payer: BLUE CROSS/BLUE SHIELD | Admitting: Sports Medicine

## 2015-05-19 ENCOUNTER — Encounter: Payer: Self-pay | Admitting: Sports Medicine

## 2015-05-19 DIAGNOSIS — G5601 Carpal tunnel syndrome, right upper limb: Secondary | ICD-10-CM

## 2015-05-19 MED ORDER — MELOXICAM 15 MG PO TABS: ORAL_TABLET | ORAL | Status: DC

## 2015-05-19 NOTE — Progress Notes (Signed)
  Subjective:    CC: Follow-up  HPI: Carpal tunnel syndrome: EMG and nerve conduction confirmed, after failure of conservative measures including nighttime splinting we performed a median nerve hydrodissection on the right wrist, she has a 50% improvement in symptoms however continues to have pain. Moderate, persistent.  Past medical history, Surgical history, Family history not pertinant except as noted below, Social history, Allergies, and medications have been entered into the medical record, reviewed, and no changes needed.   Review of Systems: No fevers, chills, night sweats, weight loss, chest pain, or shortness of breath.   Objective:    General: Well Developed, well nourished, and in no acute distress.  Neuro: Alert and oriented x3, extra-ocular muscles intact, sensation grossly intact.  HEENT: Normocephalic, atraumatic, pupils equal round reactive to light, neck supple, no masses, no lymphadenopathy, thyroid nonpalpable.  Skin: Warm and dry, no rashes. Cardiac: Regular rate and rhythm, no murmurs rubs or gallops, no lower extremity edema.  Respiratory: Clear to auscultation bilaterally. Not using accessory muscles, speaking in full sentences.  Impression and Recommendations:

## 2015-05-19 NOTE — Assessment & Plan Note (Signed)
With EMG and nerve conduction confirmed right carpal tunnel syndrome. She did not respond to oral medications, nighttime splinting, and more recently ultrasound-guided median nerve hydrodissection/injection. At this point she is a candidate for carpal tunnel release surgery. Adding meloxicam, she can also return for a additional median nerve hydrodissection, she did have a 50% response to the initial injection. Referral to Dr. Janee Mornhompson.

## 2015-08-01 ENCOUNTER — Ambulatory Visit (INDEPENDENT_AMBULATORY_CARE_PROVIDER_SITE_OTHER): Payer: BLUE CROSS/BLUE SHIELD | Admitting: Sports Medicine

## 2015-08-01 ENCOUNTER — Encounter: Payer: Self-pay | Admitting: Sports Medicine

## 2015-08-01 VITALS — BP 135/77 | HR 74 | Temp 98.2°F | Resp 16 | Wt 124.7 lb

## 2015-08-01 DIAGNOSIS — Z23 Encounter for immunization: Secondary | ICD-10-CM

## 2015-08-01 DIAGNOSIS — G5601 Carpal tunnel syndrome, right upper limb: Secondary | ICD-10-CM | POA: Diagnosis not present

## 2015-08-01 DIAGNOSIS — G43009 Migraine without aura, not intractable, without status migrainosus: Secondary | ICD-10-CM

## 2015-08-01 MED ORDER — RIZATRIPTAN BENZOATE 10 MG PO TBDP: 10.0000 mg | ORAL_TABLET | ORAL | Status: AC | PRN

## 2015-08-01 MED ORDER — TOPIRAMATE 50 MG PO TABS: ORAL_TABLET | ORAL | Status: AC

## 2015-08-01 NOTE — Assessment & Plan Note (Addendum)
EMG confirmed carpal tunnel syndrome that has failed splinting, rehabilitation exercises, NSAIDs, and a median nerve hydrodissection months ago. She was referred to surgery however never got a call. We are going to place the referral again and repeat a median nerve hydrodissection on the right side.

## 2015-08-01 NOTE — Assessment & Plan Note (Signed)
Complex migraines with hemifacial anesthesia, or a, followed by headache with photophobia, phonophobia, nausea and lasts for days. Adding Topamax as well as Maxalt. She will keep diaries of her migraines, and follow up with her PCP, she gets approximately 3 per week.

## 2015-08-01 NOTE — Progress Notes (Signed)
  Subjective:    CC: Follow-up  HPI: Carpal tunnel syndrome: Nerve conduction confirmed, previous hydrodissection months ago provided temporary benefit, approximately 50% relief, she had already failed nighttime splinting, rehabilitation exercises, NSAIDs. I did refer her to hand surgery at the last visit but she was lost to follow-up. Symptoms are moderate, persistent with paresthesias into the middle finger. Pain over the volar wrist.  Past medical history, Surgical history, Family history not pertinant except as noted below, Social history, Allergies, and medications have been entered into the medical record, reviewed, and no changes needed.   Review of Systems: No fevers, chills, night sweats, weight loss, chest pain, or shortness of breath.   Objective:    General: Well Developed, well nourished, and in no acute distress.  Neuro: Alert and oriented x3, extra-ocular muscles intact, sensation grossly intact.  HEENT: Normocephalic, atraumatic, pupils equal round reactive to light, neck supple, no masses, no lymphadenopathy, thyroid nonpalpable.  Skin: Warm and dry, no rashes. Cardiac: Regular rate and rhythm, no murmurs rubs or gallops, no lower extremity edema.  Respiratory: Clear to auscultation bilaterally. Not using accessory muscles, speaking in full sentences. Right Wrist: Inspection normal with no visible erythema or swelling. ROM smooth and normal with good flexion and extension and ulnar/radial deviation that is symmetrical with opposite wrist. Palpation is normal over metacarpals, navicular, lunate, and TFCC; tendons without tenderness/ swelling No snuffbox tenderness. No tenderness over Canal of Guyon. Strength 5/5 in all directions without pain. Negative Finkelstein, , positive Tinel's and Phalen signs. Negative Watson's test.  Procedure: Real-time Ultrasound Guided hydrodissection of right median nerve in the carpal tunnel Device: GE Logiq E  Verbal informed consent  obtained.  Time-out conducted.  Noted no overlying erythema, induration, or other signs of local infection.  Skin prepped in a sterile fashion.  Local anesthesia: Topical Ethyl chloride.  With sterile technique and under real time ultrasound guidance:  Using a total of 1 mL kenalog 40, 5 mL lidocaine, medication was injected both superficial to and deep to the median nerve in the carpal tunnel, the needle was then redirected deep into the carpal tunnel and more medication injected around the flexor tendons. Completed without difficulty  Pain immediately resolved suggesting accurate placement of the medication.  Advised to call if fevers/chills, erythema, induration, drainage, or persistent bleeding.  Images permanently stored and available for review in the ultrasound unit.  Impression: Technically successful ultrasound guided injection.  Impression and Recommendations:

## 2015-08-27 DIAGNOSIS — M25561 Pain in right knee: Secondary | ICD-10-CM | POA: Diagnosis not present

## 2015-08-27 DIAGNOSIS — M7989 Other specified soft tissue disorders: Secondary | ICD-10-CM | POA: Diagnosis not present

## 2016-06-26 DIAGNOSIS — R102 Pelvic and perineal pain: Secondary | ICD-10-CM | POA: Diagnosis not present

## 2018-11-21 ENCOUNTER — Encounter (HOSPITAL_BASED_OUTPATIENT_CLINIC_OR_DEPARTMENT_OTHER): Payer: Self-pay | Admitting: Adult Health

## 2018-11-21 ENCOUNTER — Emergency Department (HOSPITAL_BASED_OUTPATIENT_CLINIC_OR_DEPARTMENT_OTHER): Payer: 59

## 2018-11-21 ENCOUNTER — Other Ambulatory Visit: Payer: Self-pay

## 2018-11-21 ENCOUNTER — Emergency Department (HOSPITAL_BASED_OUTPATIENT_CLINIC_OR_DEPARTMENT_OTHER)
Admission: EM | Admit: 2018-11-21 | Discharge: 2018-11-21 | Disposition: A | Discharge status: Home / Self Care | Payer: 59 | Attending: Emergency Medicine | Admitting: Emergency Medicine

## 2018-11-21 DIAGNOSIS — K5900 Constipation, unspecified: Secondary | ICD-10-CM | POA: Diagnosis not present

## 2018-11-21 DIAGNOSIS — K529 Noninfective gastroenteritis and colitis, unspecified: Secondary | ICD-10-CM

## 2018-11-21 DIAGNOSIS — Z79899 Other long term (current) drug therapy: Secondary | ICD-10-CM | POA: Insufficient documentation

## 2018-11-21 DIAGNOSIS — K5289 Other specified noninfective gastroenteritis and colitis: Secondary | ICD-10-CM | POA: Insufficient documentation

## 2018-11-21 DIAGNOSIS — R109 Unspecified abdominal pain: Secondary | ICD-10-CM | POA: Diagnosis present

## 2018-11-21 LAB — URINALYSIS, ROUTINE W REFLEX MICROSCOPIC
Glucose, UA: NEGATIVE mg/dL
Ketones, ur: 15 mg/dL — AB
Leukocytes,Ua: NEGATIVE
Nitrite: NEGATIVE
Protein, ur: NEGATIVE mg/dL
Specific Gravity, Urine: >1.03 — ABNORMAL HIGH (ref 1.005–1.030)
pH: 5 (ref 5.0–8.0)

## 2018-11-21 LAB — COMPREHENSIVE METABOLIC PANEL
ALT: 18 U/L (ref 0–44)
AST: 21 U/L (ref 15–41)
Albumin: 4.7 g/dL (ref 3.5–5.0)
Alkaline Phosphatase: 69 U/L (ref 38–126)
Anion gap: 9 (ref 5–15)
BUN: 9 mg/dL (ref 6–20)
CO2: 22 mmol/L (ref 22–32)
Calcium: 9.8 mg/dL (ref 8.9–10.3)
Chloride: 107 mmol/L (ref 98–111)
Creatinine, Ser: 0.8 mg/dL (ref 0.44–1.00)
GFR calc Af Amer: >60 mL/min (ref >60)
GFR calc non Af Amer: >60 mL/min (ref >60)
Glucose, Bld: 105 mg/dL — ABNORMAL HIGH (ref 70–99)
Potassium: 3.8 mmol/L (ref 3.5–5.1)
Sodium: 138 mmol/L (ref 135–145)
Total Bilirubin: 0.8 mg/dL (ref 0.3–1.2)
Total Protein: 8.2 g/dL — ABNORMAL HIGH (ref 6.5–8.1)

## 2018-11-21 LAB — CBC WITH DIFFERENTIAL/PLATELET
Abs Immature Granulocytes: 0.08 10*3/uL — ABNORMAL HIGH (ref 0.00–0.07)
Basophils Absolute: 0.1 10*3/uL (ref 0.0–0.1)
Basophils Relative: 0 %
Eosinophils Absolute: 0 10*3/uL (ref 0.0–0.5)
Eosinophils Relative: 0 %
HCT: 48.6 % — ABNORMAL HIGH (ref 36.0–46.0)
Hemoglobin: 15.9 g/dL — ABNORMAL HIGH (ref 12.0–15.0)
Immature Granulocytes: 0 %
Lymphocytes Relative: 7 %
Lymphs Abs: 1.6 10*3/uL (ref 0.7–4.0)
MCH: 29.5 pg (ref 26.0–34.0)
MCHC: 32.7 g/dL (ref 30.0–36.0)
MCV: 90.2 fL (ref 80.0–100.0)
Monocytes Absolute: 0.7 10*3/uL (ref 0.1–1.0)
Monocytes Relative: 3 %
Neutro Abs: 19 10*3/uL — ABNORMAL HIGH (ref 1.7–7.7)
Neutrophils Relative %: 90 %
Platelets: 424 10*3/uL — ABNORMAL HIGH (ref 150–400)
RBC: 5.39 MIL/uL — ABNORMAL HIGH (ref 3.87–5.11)
RDW: 13.2 % (ref 11.5–15.5)
WBC: 21.4 10*3/uL — ABNORMAL HIGH (ref 4.0–10.5)
nRBC: 0 % (ref 0.0–0.2)

## 2018-11-21 LAB — LIPASE, BLOOD: Lipase: 30 U/L (ref 11–51)

## 2018-11-21 LAB — URINALYSIS, MICROSCOPIC (REFLEX)

## 2018-11-21 MED ORDER — METRONIDAZOLE 500 MG PO TABS: 500.0000 mg | ORAL_TABLET | Freq: Two times a day (BID) | ORAL | 0 refills | Status: AC

## 2018-11-21 MED ORDER — DICYCLOMINE HCL 20 MG PO TABS: 20.0000 mg | ORAL_TABLET | Freq: Two times a day (BID) | ORAL | 0 refills | Status: AC | PRN

## 2018-11-21 MED ORDER — ONDANSETRON 4 MG PO TBDP: 4.0000 mg | ORAL_TABLET | Freq: Three times a day (TID) | ORAL | 0 refills | Status: AC | PRN

## 2018-11-21 MED ORDER — FENTANYL CITRATE (PF) 100 MCG/2ML IJ SOLN
50.0000 ug | Freq: Once | INTRAMUSCULAR | Status: AC
  Administered 2018-11-21: 11:00:00 50 ug via INTRAVENOUS
  Filled 2018-11-21: qty 2

## 2018-11-21 MED ORDER — CIPROFLOXACIN HCL 500 MG PO TABS: 500.0000 mg | ORAL_TABLET | Freq: Two times a day (BID) | ORAL | 0 refills | Status: AC

## 2018-11-21 MED ORDER — DICYCLOMINE HCL 10 MG PO CAPS
10.0000 mg | ORAL_CAPSULE | Freq: Once | ORAL | Status: AC
  Administered 2018-11-21: 12:00:00 10 mg via ORAL
  Filled 2018-11-21: qty 1

## 2018-11-21 MED ORDER — ONDANSETRON HCL 4 MG/2ML IJ SOLN
4.0000 mg | Freq: Once | INTRAMUSCULAR | Status: AC
  Administered 2018-11-21: 11:00:00 4 mg via INTRAVENOUS
  Filled 2018-11-21: qty 2

## 2018-11-21 MED ORDER — METOCLOPRAMIDE HCL 5 MG/ML IJ SOLN
10.0000 mg | Freq: Once | INTRAMUSCULAR | Status: AC
  Administered 2018-11-21: 13:00:00 10 mg via INTRAVENOUS
  Filled 2018-11-21: qty 2

## 2018-11-21 MED ORDER — FENTANYL CITRATE (PF) 100 MCG/2ML IJ SOLN
25.0000 ug | Freq: Once | INTRAMUSCULAR | Status: AC
  Administered 2018-11-21: 12:00:00 25 ug via INTRAVENOUS
  Filled 2018-11-21: qty 2

## 2018-11-21 MED ORDER — IOHEXOL 300 MG/ML  SOLN
100.0000 mL | Freq: Once | INTRAMUSCULAR | Status: AC | PRN
  Administered 2018-11-21: 11:00:00 100 mL via INTRAVENOUS

## 2018-11-21 MED ORDER — METRONIDAZOLE 500 MG PO TABS
500.0000 mg | ORAL_TABLET | Freq: Once | ORAL | Status: AC
  Administered 2018-11-21: 14:00:00 500 mg via ORAL
  Filled 2018-11-21: qty 1

## 2018-11-21 MED ORDER — CIPROFLOXACIN HCL 500 MG PO TABS
500.0000 mg | ORAL_TABLET | Freq: Once | ORAL | Status: AC
  Administered 2018-11-21: 14:00:00 500 mg via ORAL
  Filled 2018-11-21: qty 1

## 2018-11-21 MED ORDER — SODIUM CHLORIDE 0.9 % IV BOLUS
1000.0000 mL | Freq: Once | INTRAVENOUS | Status: AC
  Administered 2018-11-21: 11:00:00 1000 mL via INTRAVENOUS

## 2018-11-21 MED ORDER — HYDROCODONE-ACETAMINOPHEN 5-325 MG PO TABS: 1.0000 | ORAL_TABLET | Freq: Four times a day (QID) | ORAL | 0 refills | Status: AC | PRN

## 2018-11-21 MED FILL — metroNIDAZOLE 500 MG TABS: 500 | 7 days supply | Qty: 13 | Fill #0

## 2018-11-21 MED FILL — CIPROFLOXACIN HCL 500 MG TA: 500 | 7 days supply | Qty: 13 | Fill #0

## 2018-11-21 MED FILL — ONDANSETRON ODT 4 MG TABLET: 4 | 3 days supply | Qty: 9 | Fill #0

## 2018-11-21 MED FILL — DICYCLOMINE 20 MG TABLET: 20 | 10 days supply | Qty: 20 | Fill #0

## 2018-11-21 MED FILL — HYDROCODON-APAP 5-325: 5-325 | 2 days supply | Qty: 6 | Fill #0

## 2018-11-21 NOTE — ED Triage Notes (Signed)
Presents with lower abdominal cramping and pain that began this week and has worsened over the last 24 hours. She reports vomiting bile multiple times throughout the evening and morning. Last emesis 45 minutes ago. She last had a normal BM over two weeks ago, but had a small hard BM on Sunday. 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 She has a history of constipation.

## 2018-11-21 NOTE — Discharge Instructions (Addendum)
Take antibiotics as prescribed.  Take the entire course, even if your symptoms improve. Use Tylenol and ibuprofen as needed for mild to moderate pain.  Use Norco as needed for severe breakthrough pain.  Have caution, this is a narcotic medicine.  Do not drive or operate heavy machinery while taking this medicine. Use Zofran as needed for nausea or vomiting. Use Bentyl as needed for abdominal cramping. Use heating pads to help with pain. Make sure you are staying well-hydrated water. Follow-up with GI for further evaluation of your symptoms. Return to the emergency room if you develop high fevers, persistent vomiting, severe worsening pain, or any new, worsening, or concerning symptoms.

## 2018-11-21 NOTE — ED Provider Notes (Signed)
[facility] EMERGENCY DEPARTMENT Provider Note   CSN: [id number] Arrival date & time: 11/21/18  [id number]    History   Chief Complaint Chief Complaint  Patient presents with  . Abdominal Pain    HPI Mary Faulkner is a 43 y.o. female presenting for evaluation of lower abdominal cramping and constipation.  Patient states she has been having increasing lower abdominal cramping and pain over the past 24 hours.  She has associated epigastric burning.  Patient states she had a very hard and small bowel movement last night, took a Dulcolax and then started vomiting several hours later.  She reports persistent vomiting, states she is throwing up bile and green liquid.  Patient attempted a soapsuds enema today, noticed blood coming from her rectum with no stool output.  As such, she came to the ER.  Patient is not sure when her last normal BM was, states she has irregular bowels, will often go 1 week in between bowel movements.  Of note, patient states she had a viral illness 2 weeks ago including fever, nausea, vomiting, abdominal pain.  The symptoms completely resolved before today's episode.  She has a history of cholecystectomy, hysterectomy, left nephrectomy, and laparoscopic removal of scar tissue adhesions.  She denies history of bowel obstruction or diverticulitis.  Pain is worse in her left lower quadrant, and worse with palpation and laying flat.  Nothing makes it better.  She denies current fevers, chills, sore throat, cough, chest pain, shortness of breath, urinary symptoms, vaginal discharge.  Pt has a h/o gerd, but sxs are infrequent and she does not take anything for this. She has a h/o constipation and "bowel problems" but no diagnosis. She has a history of migraines and depression for which she takes Wellbutrin, takes no other medications daily.  She denies tobacco or drug use.  She uses alcohol occasionally, none recently.   She had an EDG many years ago, colonoscopy in her  teens.      HPI  History reviewed. No pertinent past medical history.  Patient Active Problem List   Diagnosis Date Noted  . Migraine headache 12/08/2014  . C7 radiculopathy 10/19/2014  . Muscle spasms of neck 09/22/2014  . Right carpal tunnel syndrome 09/20/2014  . Depression 06/14/2014  . Generalized anxiety disorder 06/14/2014  . Insomnia 06/14/2014  . Pain in the chest 06/14/2014    Past Surgical History:  Procedure Laterality Date  . ABDOMINAL HYSTERECTOMY    . CESAREAN SECTION    . CHOLECYSTECTOMY       OB History   No obstetric history on file.      Home Medications    Prior to Admission medications   Medication Sig Start Date End Date Taking? Authorizing Provider  ciprofloxacin (CIPRO) 500 MG tablet Take 1 tablet (500 mg total) by mouth every 12 (twelve) hours for 7 days. 11/21/18 11/28/18  Sharlet Notaro, PA-C  dicyclomine (BENTYL) 20 MG tablet Take 1 tablet (20 mg total) by mouth 2 (two) times daily as needed for spasms. 11/21/18   Brockton Mckesson, PA-C  HYDROcodone-acetaminophen (NORCO/VICODIN) 5-325 MG tablet Take 1 tablet by mouth every 6 (six) hours as needed for severe pain. 11/21/18   Sahana Boyland, PA-C  metroNIDAZOLE (FLAGYL) 500 MG tablet Take 1 tablet (500 mg total) by mouth 2 (two) times daily. 11/21/18   Natalye Kott, PA-C  ondansetron (ZOFRAN ODT) 4 MG disintegrating tablet Take 1 tablet (4 mg total) by mouth every 8 (eight) hours as needed for nausea or  vomiting. 11/21/18   Kasim Mccorkle, PA-C  rizatriptan (MAXALT-MLT) 10 MG disintegrating tablet Take 1 tablet (10 mg total) by mouth as needed for migraine. May repeat in 2 hours if needed 08/01/15   Monica Becton, MD  topiramate (TOPAMAX) 50 MG tablet One half tab by mouth a day for one week then one tab by mouth daily. 08/01/15   Monica Becton, MD    Family History Family History  Problem Relation Age of Onset  . Hyperlipidemia Father   . Hypertension Father   .  Hyperlipidemia Brother   . Cancer Paternal Grandmother   . Alcohol abuse Paternal Grandfather   . Cancer Paternal Grandfather     Social History Social History   Tobacco Use  . Smoking status: Never Smoker  Substance Use Topics  . Alcohol use: Yes  . Drug use: No     Allergies   Morphine and related and Sulfa antibiotics   Review of Systems Review of Systems  Gastrointestinal: Positive for abdominal pain, constipation, nausea and vomiting.  All other systems reviewed and are negative.    Physical Exam Updated Vital Signs BP 105/63   Pulse 61   Temp 98.3 F (36.8 C) (Oral)   Resp 16   Ht  (1.575 m)   Wt 54.4 kg   SpO2 100%   BMI 21.95 kg/m   Physical Exam Vitals signs and nursing note reviewed.  Constitutional:      General: She is not in acute distress.    Appearance: She is well-developed.     Comments: Appears uncomfortable due to pain, nontoxic in appearance  HENT:     Head: Normocephalic and atraumatic.  Eyes:     Conjunctiva/sclera: Conjunctivae normal.     Pupils: Pupils are equal, round, and reactive to light.  Neck:     Musculoskeletal: Normal range of motion and neck supple.  Cardiovascular:     Rate and Rhythm: Normal rate and regular rhythm.     Pulses: Normal pulses.  Pulmonary:     Effort: Pulmonary effort is normal. No respiratory distress.     Breath sounds: Normal breath sounds. No wheezing.  Abdominal:     General: There is no distension.     Palpations: Abdomen is soft.     Tenderness: There is abdominal tenderness in the right lower quadrant, suprapubic area and left lower quadrant. There is no right CVA tenderness, left CVA tenderness, guarding or rebound. Negative signs include Murphy's sign and McBurney's sign.  Musculoskeletal: Normal range of motion.  Skin:    General: Skin is warm and dry.     Capillary Refill: Capillary refill takes less than 2 seconds.  Neurological:     Mental Status: She is alert and oriented to  person, place, and time.      ED Treatments / Results  Labs (all labs ordered are listed, but only abnormal results are displayed) Labs Reviewed  CBC WITH DIFFERENTIAL/PLATELET - Abnormal; Notable for the following components:      Result Value   WBC 21.4 (*)    RBC 5.39 (*)    Hemoglobin 15.9 (*)    HCT 48.6 (*)    Platelets 424 (*)    Neutro Abs 19.0 (*)    Abs Immature Granulocytes 0.08 (*)    All other components within normal limits  COMPREHENSIVE METABOLIC PANEL - Abnormal; Notable for the following components:   Glucose, Bld 105 (*)    Total Protein 8.2 (*)  All other components within normal limits  URINALYSIS, ROUTINE W REFLEX MICROSCOPIC - Abnormal; Notable for the following components:   Color, Urine AMBER (*)    APPearance HAZY (*)    Specific Gravity, Urine >1.030 (*)    Hgb urine dipstick SMALL (*)    Bilirubin Urine MODERATE (*)    Ketones, ur 15 (*)    All other components within normal limits  URINALYSIS, MICROSCOPIC (REFLEX) - Abnormal; Notable for the following components:   Bacteria, UA MANY (*)    All other components within normal limits  LIPASE, BLOOD    EKG None  Radiology Ct Abdomen Pelvis W Contrast  Result Date: 11/21/2018 CLINICAL DATA:  Abdominal pain and cramping for the past week with nausea and vomiting. EXAM: CT ABDOMEN AND PELVIS WITH CONTRAST TECHNIQUE: Multidetector CT imaging of the abdomen and pelvis was performed using the standard protocol following bolus administration of intravenous contrast. CONTRAST:  OMNIPAQUE IOHEXOL 300 MG/ML  SOLN COMPARISON:  None. FINDINGS: Lower chest: No acute abnormality. Hepatobiliary: No focal liver abnormality is seen. Status post cholecystectomy. No biliary dilatation. Pancreas: Unremarkable. No pancreatic ductal dilatation or surrounding inflammatory changes. Spleen: Normal in size without focal abnormality. Adrenals/Urinary Tract: Adrenal glands are unremarkable. Kidneys are normal, without  renal calculi, focal lesion, or hydronephrosis. Bladder is decompressed. Stomach/Bowel: The stomach and small bowel are unremarkable. There is mild wall thickening of the cecum, ascending and descending colon, and portions of the transverse colon. Normal appendix. Vascular/Lymphatic: No significant vascular findings are present. No enlarged abdominal or pelvic lymph nodes. Reproductive: Status post hysterectomy. No adnexal masses. Right corpus luteum. Other: Trace free fluid in the pelvis. Tiny fat containing umbilical hernia. No pneumoperitoneum. Musculoskeletal: No acute or significant osseous findings. IMPRESSION: 1. Acute colitis involving the cecum, ascending and descending colon, and portions of the transverse colon. No obstruction. Electronically Signed   By: Obie Dredge M.D.   On: 11/21/2018 11:28    Procedures Procedures (including critical care time)  Medications Ordered in ED Medications  ondansetron (ZOFRAN) injection 4 mg (4 mg Intravenous Given 11/21/18 1035)  fentaNYL (SUBLIMAZE) injection 50 mcg (50 mcg Intravenous Given 11/21/18 1035)  sodium chloride 0.9 % bolus 1,000 mL (0 mLs Intravenous Stopped 11/21/18 1150)  iohexol (OMNIPAQUE) 300 MG/ML solution 100 mL (100 mLs Intravenous Contrast Given 11/21/18 1108)  fentaNYL (SUBLIMAZE) injection 25 mcg (25 mcg Intravenous Given 11/21/18 1206)  dicyclomine (BENTYL) capsule 10 mg (10 mg Oral Given 11/21/18 1206)  metoCLOPramide (REGLAN) injection 10 mg (10 mg Intravenous Given 11/21/18 1323)  ciprofloxacin (CIPRO) tablet 500 mg (500 mg Oral Given 11/21/18 1423)  metroNIDAZOLE (FLAGYL) tablet 500 mg (500 mg Oral Given 11/21/18 1423)     Initial Impression / Assessment and Plan / ED Course  I have reviewed the triage vital signs and the nursing notes.  Pertinent labs & imaging results that were available during my care of the patient were reviewed by me and considered in my medical decision making (see chart for details).         Patient presenting for evaluation of nausea, vomiting, constipation, lower abdominal pain.   Physical examination, patient is afebrile and appears nontoxic.  However, tenderness palpation lower abdomen, worse in the left lower quadrant.  As she has had multiple abdominal surgeries, consider bowel obstruction.  Also consider diverticulitis.  Low suspicion for GU pathology, as patient only has a right ovary, and is without vaginal discharge.  Also consider UTI, although without urinary symptoms less likely.  Consider viral GI illness and constipation as well.  Will obtain labs, UA, CT, treat symptomatically and reassess.  Labs show elevated white count at 21.  Hemoglobin reassuring, in fact slightly elevated.  Creatinine stable.  Urine without obvious infection, does show many bacteria however no nitrites or leuks.  As she does not have any urinary symptoms, low suspicion for UTI.  CT abdomen pelvis pending.  On reassessment, patient reports nausea and pain improved, however pain is returning.  Will give another dose of pain medication and Bentyl.  CT abdomen pelvis shows colitis of the cecum, ascending and descending colon, and parts of the transverse colon.  Discussed findings with patient.  Discussed continued attempts at symptomatic treatment in the ED and discharge.  However, if unable to control patient symptoms, may need to admit.  After further rounds of pain medication and nausea control, patient is able to tolerate p.o. in the ED, and would like to go home.  Discussed return precautions.  F/u with GI. At this time, patient appears safe for discharge.  Patient states she understands agrees to plan.   Final Clinical Impressions(s) / ED Diagnoses   Final diagnoses:  Colitis  Constipation, unspecified constipation type    ED Discharge Orders         Ordered    ciprofloxacin (CIPRO) 500 MG tablet  Every 12 hours     11/21/18 1406    metroNIDAZOLE (FLAGYL) 500 MG tablet  2 times daily      11/21/18 1406    dicyclomine (BENTYL) 20 MG tablet  2 times daily PRN     11/21/18 1406    HYDROcodone-acetaminophen (NORCO/VICODIN) 5-325 MG tablet  Every 6 hours PRN     11/21/18 1406    ondansetron (ZOFRAN ODT) 4 MG disintegrating tablet  Every 8 hours PRN     11/21/18 1406           Jahmeek Shirk, PA-C 11/21/18 1738    Cathren Laine, MD 11/22/18 [phone]

## 2018-11-24 ENCOUNTER — Telehealth: Payer: Self-pay | Admitting: Neurology

## 2018-11-24 NOTE — Telephone Encounter (Signed)
Jomarie Longs, PA-C sent to Silvio Pate, CMA        Can we follow up on ED visit? See how patient is doing today?    Left message on machine for patient to call back. To see how she was feeling today. Awaiting call back.

## 2018-11-25 NOTE — Telephone Encounter (Signed)
Called patient again to check on her. She states she is no longer a patient here, her new PCP is [facility] with [address]. She didn't want to discuss further. Jade removed as PCP.   Jade - FYI.

## 2018-11-25 NOTE — Telephone Encounter (Signed)
Thanks

## 2021-03-05 IMAGING — CT CT ABDOMEN AND PELVIS WITH CONTRAST
2 of 5 series · 15 of 46 positions shown, 17 images · IV contrast (APPLIED)
Comparison: None.

CLINICAL DATA: Abdominal pain and cramping for the past week with
nausea and vomiting.

EXAM:
CT ABDOMEN AND PELVIS WITH CONTRAST
TECHNIQUE: Multidetector CT imaging of the abdomen and pelvis was performed
using the standard protocol following bolus administration of
intravenous contrast.
CONTRAST:  100mL OMNIPAQUE IOHEXOL 300 MG/ML  SOLN

[Series 2: axial st · axial · 0.65mm/px · z∈[-444,-24]mm · 12 of 94 slices shown, 14 images]
[im 5/94  soft-tissue]
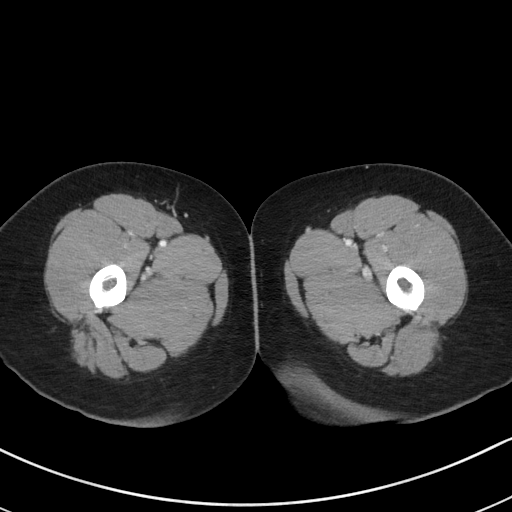
[im 5/94  bone]
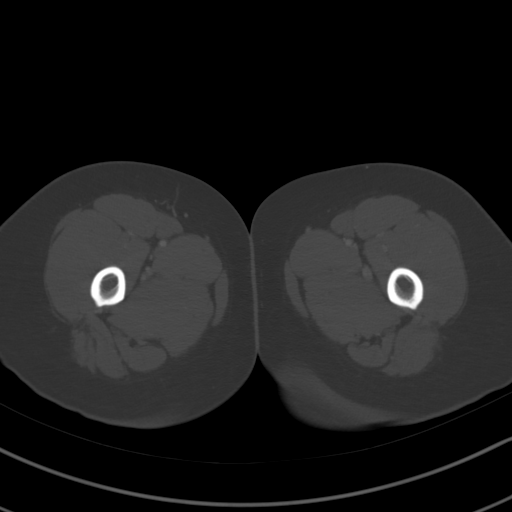
[im 14/94  soft-tissue]
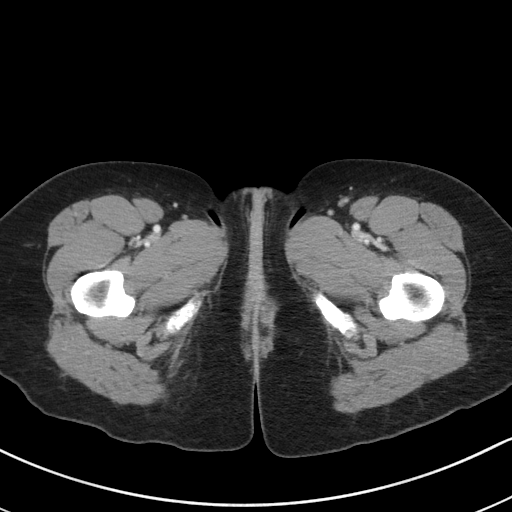
[im 23/94  soft-tissue]
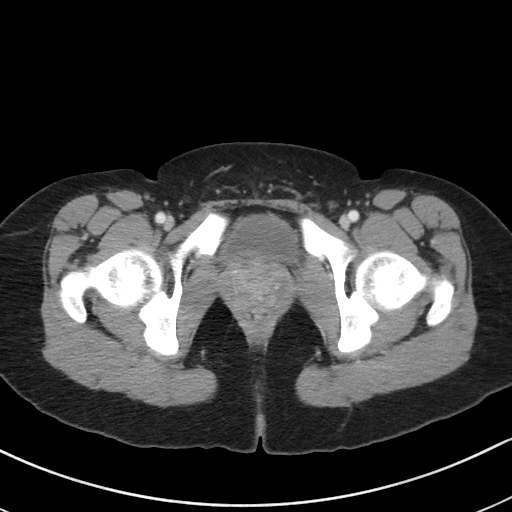
[im 27/94  soft-tissue]
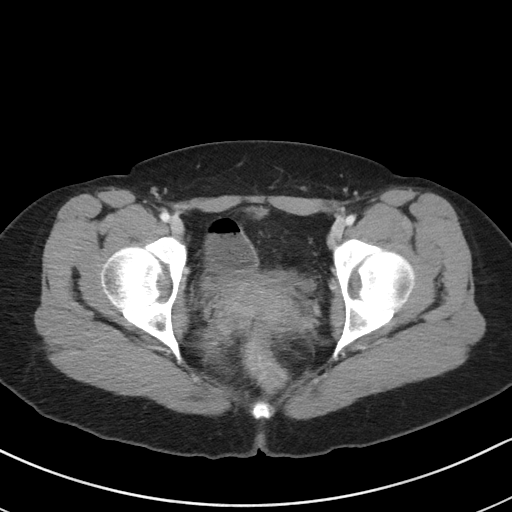
[im 36/94  soft-tissue]
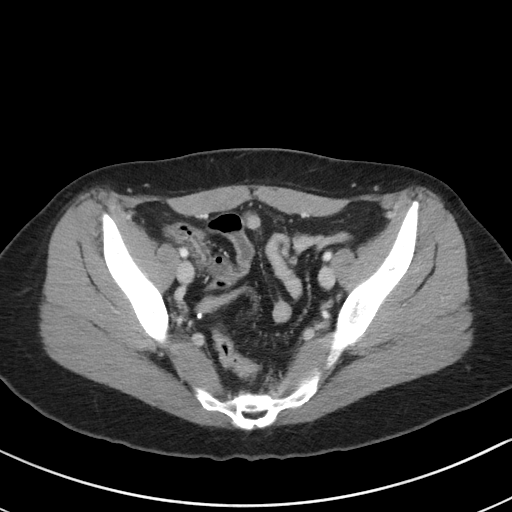
[im 45/94  soft-tissue]
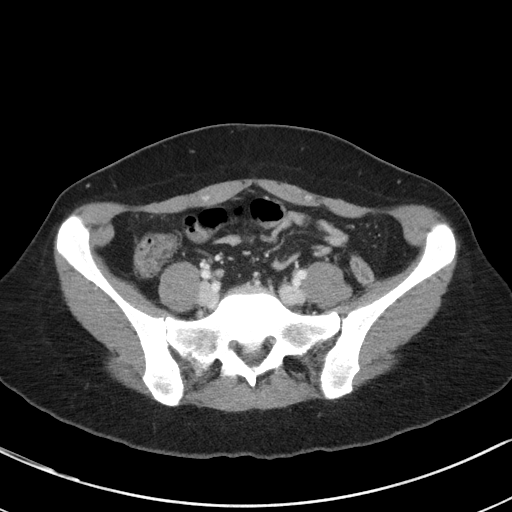
[im 49/94  soft-tissue]
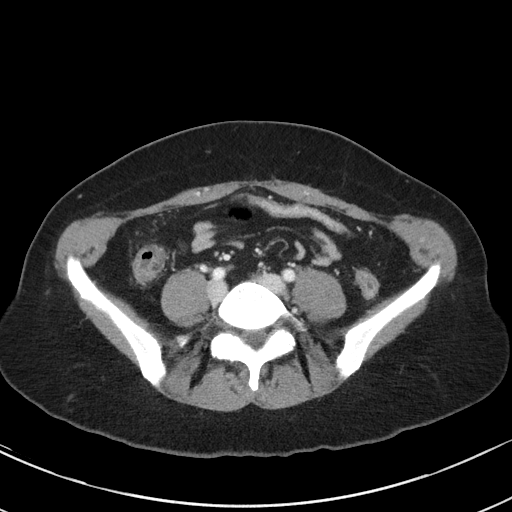
[im 58/94  soft-tissue]
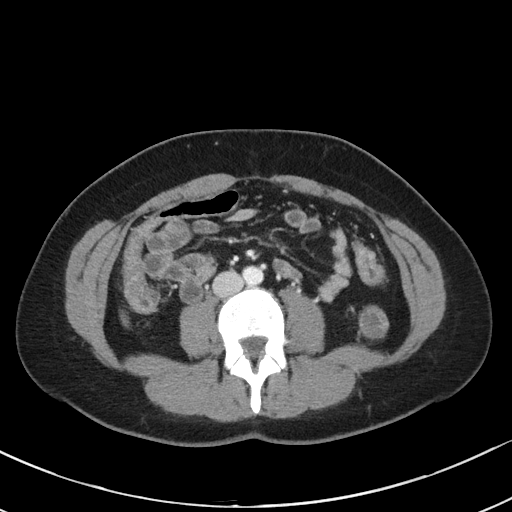
[im 67/94  soft-tissue]
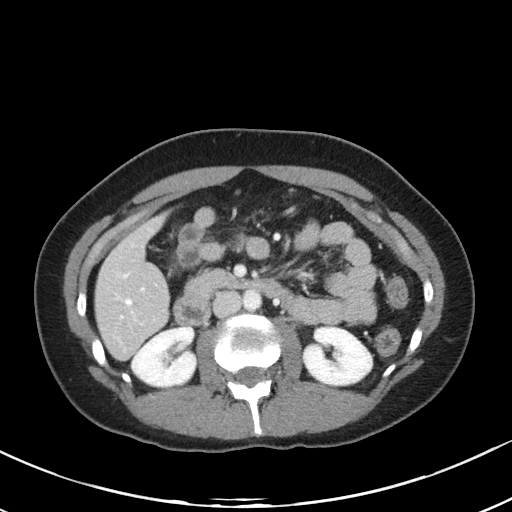
[im 67/94  bone]
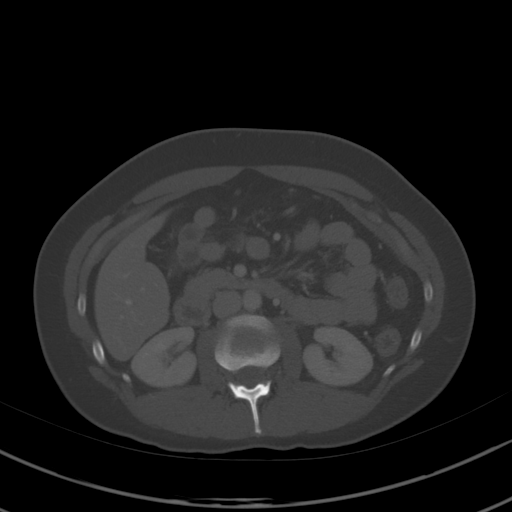
[im 71/94  soft-tissue]
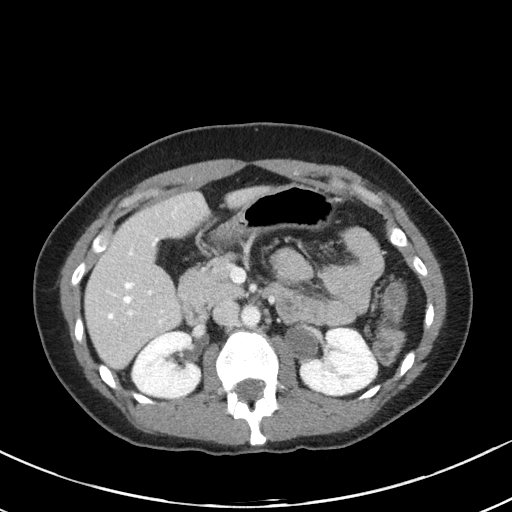
[im 80/94  soft-tissue]
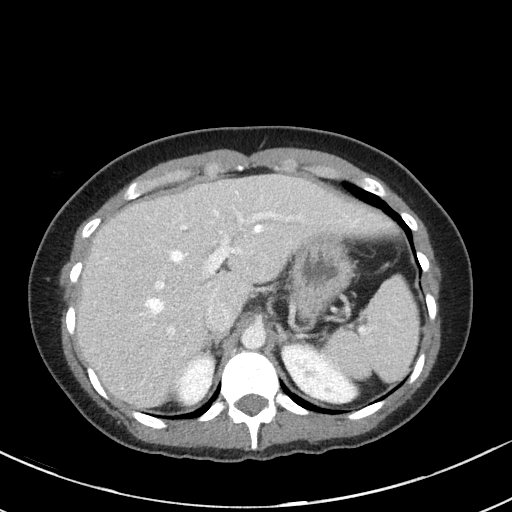
[im 89/94  soft-tissue]
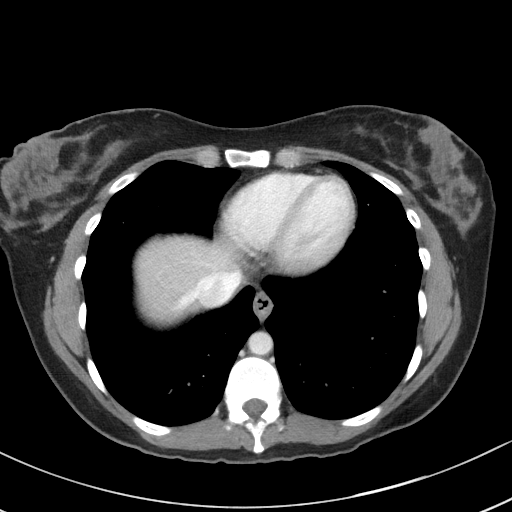

[Series 5: coronal st · coronal · 0.67mm/px · 3 of 71 slices shown]
[im 24/71  soft-tissue]
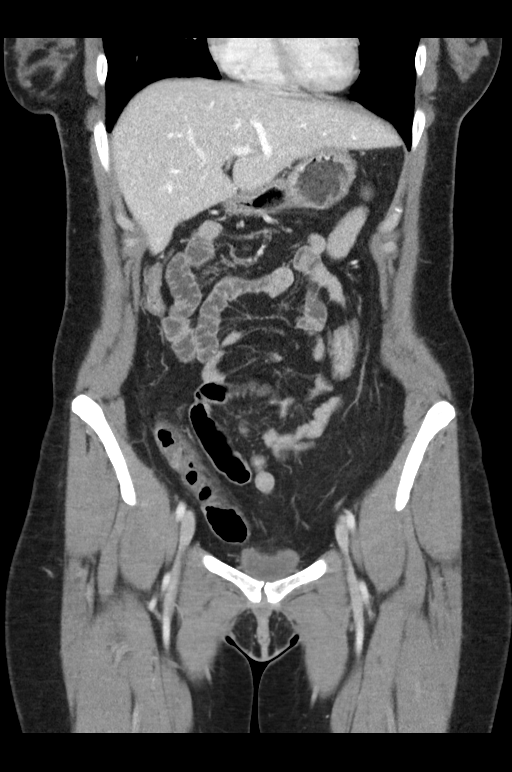
[im 32/71  soft-tissue]
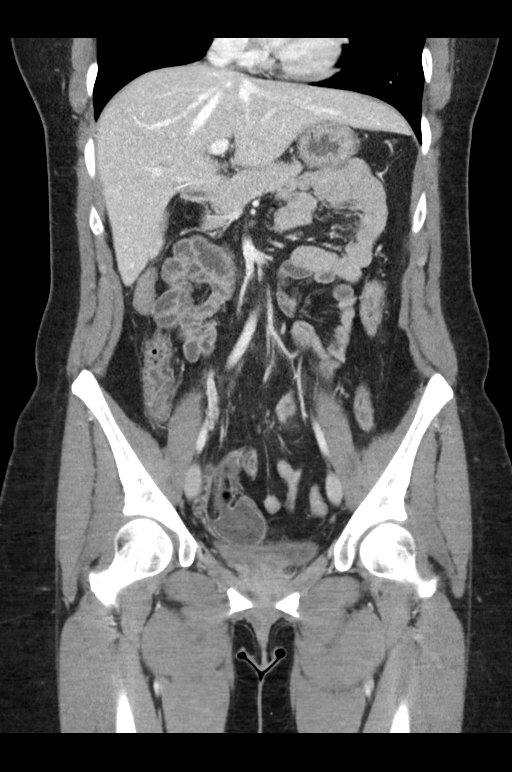
[im 39/71  soft-tissue]
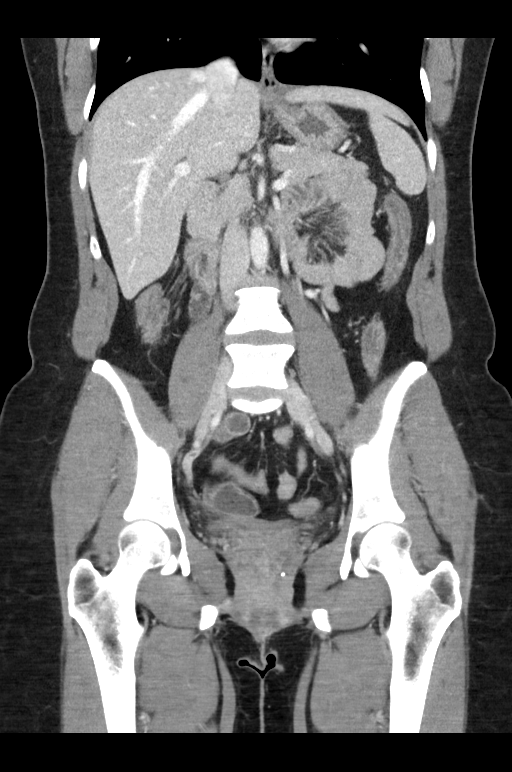

[15 of 46 positions shown; findings below may reference images not displayed]

FINDINGS: Lower chest: No acute abnormality.

Hepatobiliary: No focal liver abnormality is seen. Status post
cholecystectomy. No biliary dilatation.

Pancreas: Unremarkable. No pancreatic ductal dilatation or
surrounding inflammatory changes.

Spleen: Normal in size without focal abnormality.

Adrenals/Urinary Tract: Adrenal glands are unremarkable. Kidneys are
normal, without renal calculi, focal lesion, or hydronephrosis.
Bladder is decompressed.

Stomach/Bowel: The stomach and small bowel are unremarkable. There
is mild wall thickening of the cecum, ascending and descending
colon, and portions of the transverse colon. Normal appendix.

Vascular/Lymphatic: No significant vascular findings are present. No
enlarged abdominal or pelvic lymph nodes.

Reproductive: Status post hysterectomy. No adnexal masses. Right
corpus luteum.

Other: Trace free fluid in the pelvis. Tiny fat containing umbilical
hernia. No pneumoperitoneum.

Musculoskeletal: No acute or significant osseous findings.
IMPRESSION: 1. Acute colitis involving the cecum, ascending and descending
colon, and portions of the transverse colon. No obstruction.
